# Patient Record
Sex: Female | Born: 1975
Health system: Midwestern US, Academic
[De-identification: ages and names within clinical notes are randomized; demographics above are authoritative.]

---

## 2016-11-30 ENCOUNTER — Encounter: Admit: 2016-11-30 | Discharge: 2016-11-30

## 2020-06-01 IMAGING — CR [ID]
3 series · 3 of 3 positions shown · non-contrast
Comparison: none

[lspine ap]
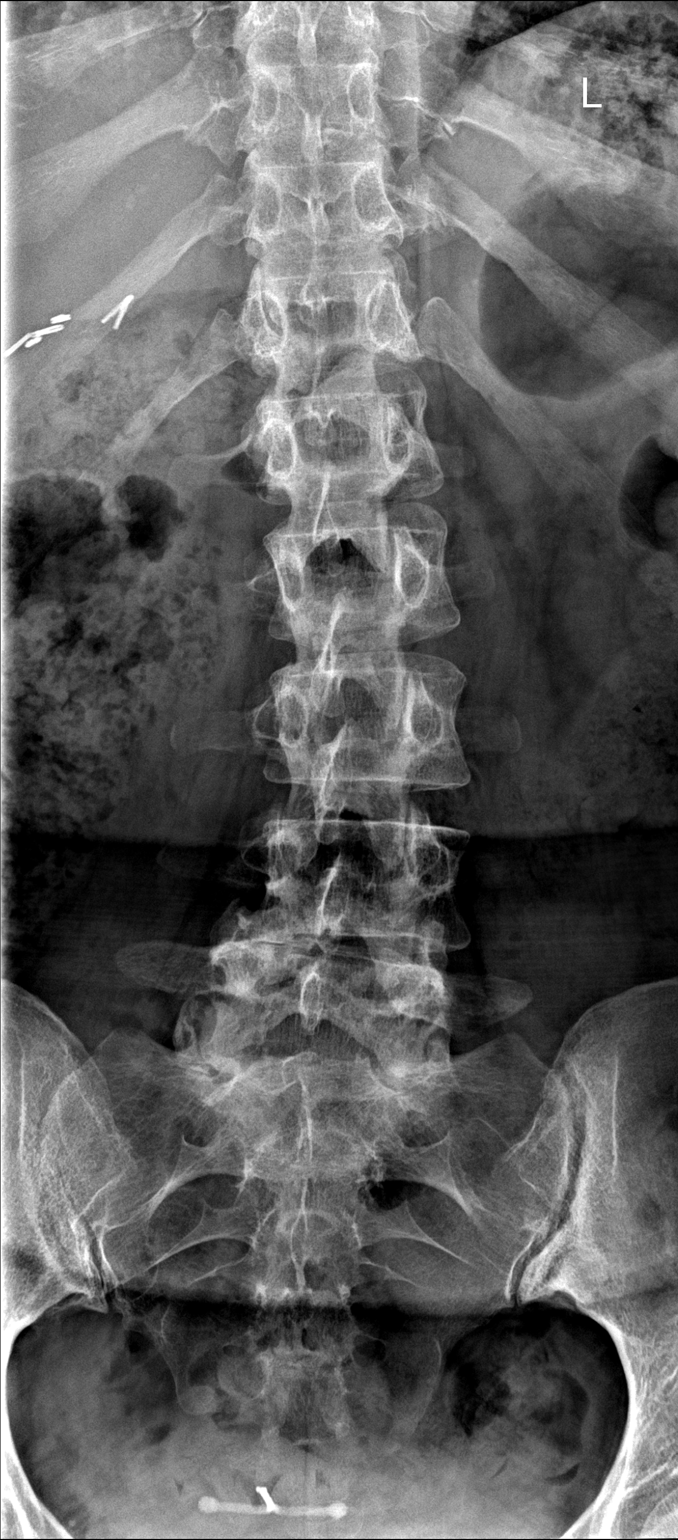

[lspine obl]
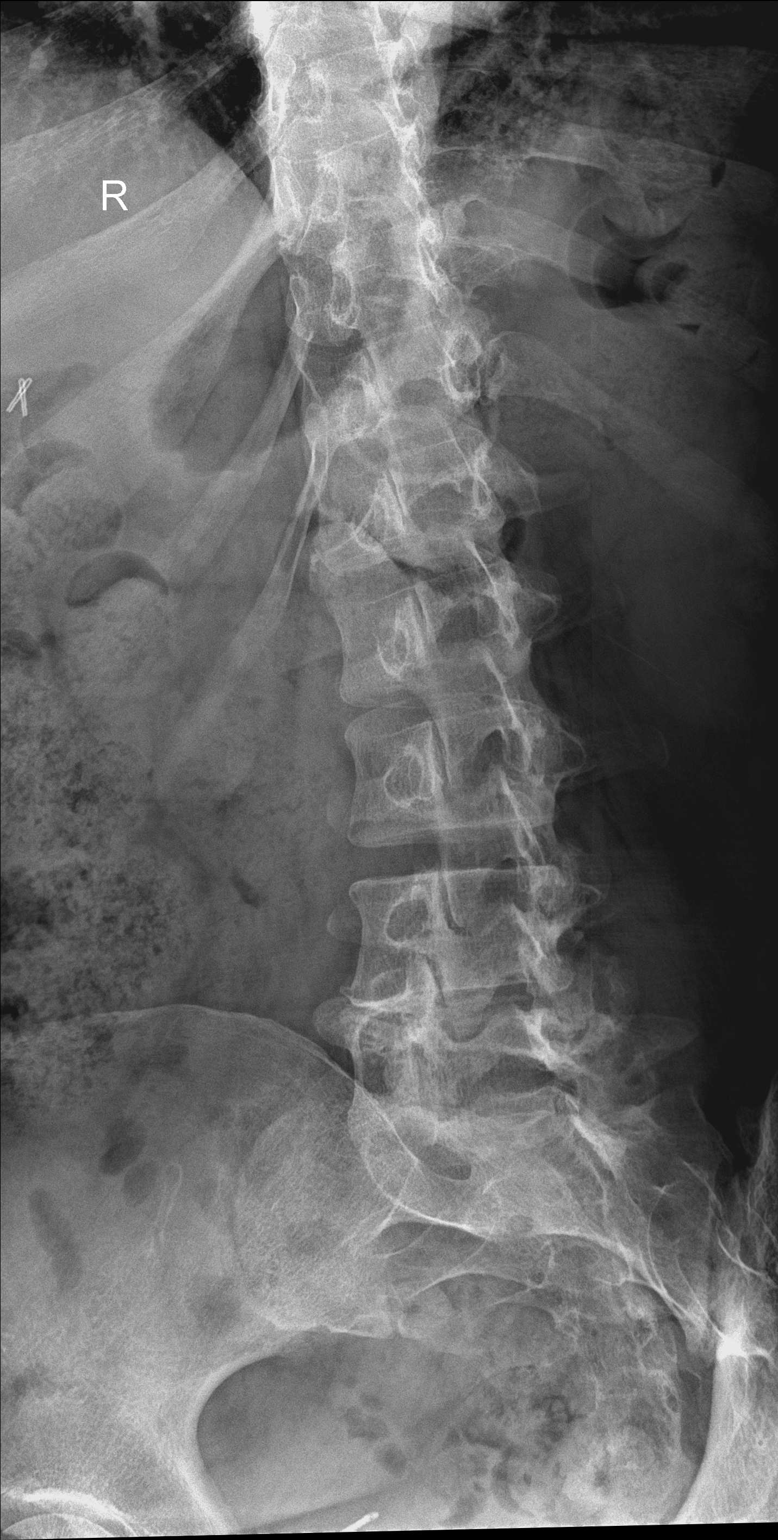

[lspine lat]
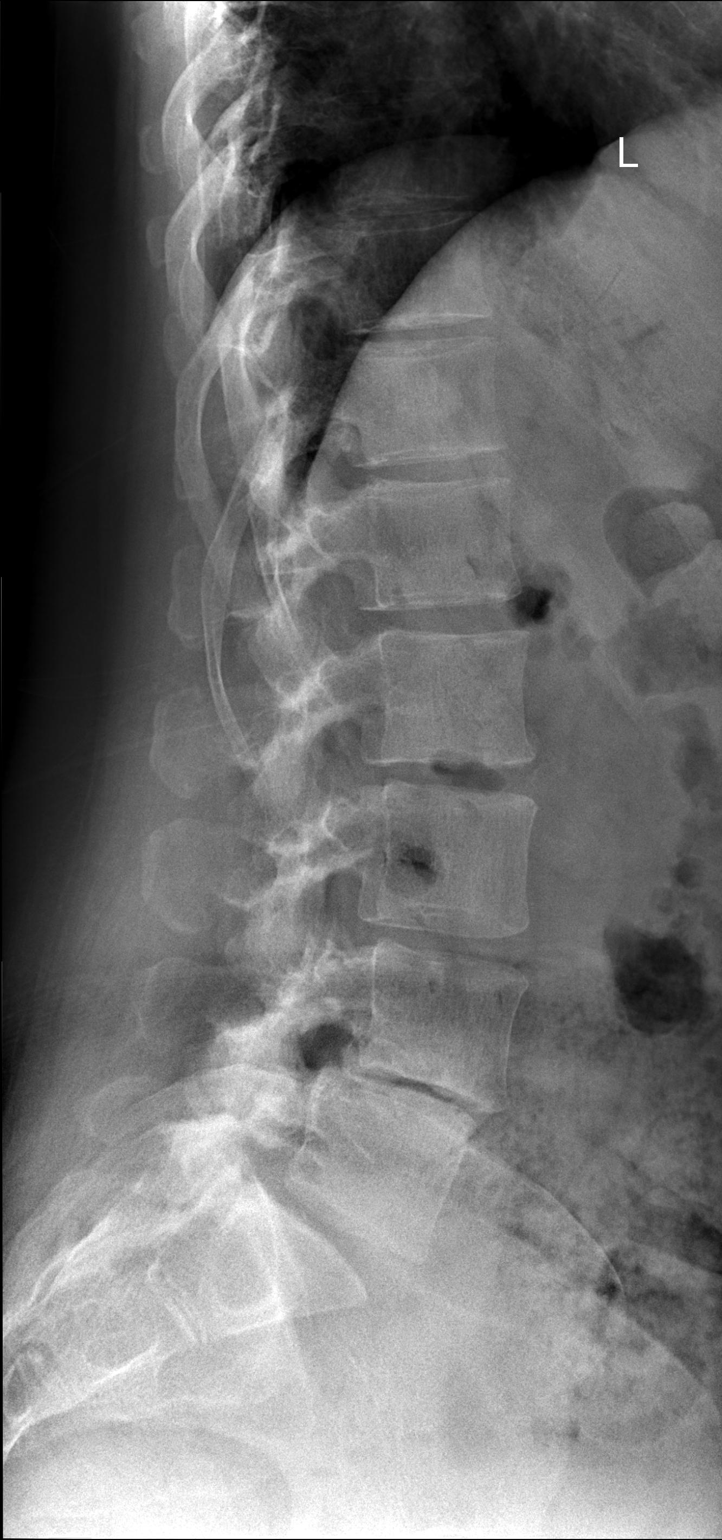

[3 of 3 positions shown; findings below may reference images not displayed]

DIAGNOSTIC STUDIES

EXAM

XR lumbar spine 5 view

INDICATION

back pain
Patient complains of left lower back pain that will radiate down left leg sometimes. No known
injury.

TECHNIQUE

AP, bilateral oblique, lateral, and cone-down lateral lumbar spine views.

COMPARISONS

None.

FINDINGS

Cholecystectomy clips are noted in the right upper quadrant. An IUD is also noted in the pelvis.
Mild apex left curvature of the lumbar spine. No compression deformity of the lumbar vertebrae.
There is grade 1 anterolisthesis of L4 on L5 with moderate to severe loss of disc height and
endplate sclerosis also noted at L4-5. The other disc spaces are well maintained. There is slight
anterolisthesis of L5 on S1. No compelling evidence of spondylolysis. Facet arthropathy is noted in
the lower lumbar spine.

IMPRESSION

Disc degeneration at L4-5 with grade 1 anterolisthesis of L4 on L5.

Tech Notes:

Patient complains of left lower back pain that will radiate down left leg sometimes. No known
injury.

## 2020-08-19 IMAGING — MR L-spine^LUMBAR BLOCK
3 series · 23 of 23 positions shown · non-contrast
Comparison: none

[Series 2: T2 · sagittal · 4.0mm · 0.62mm/px · 12 of 12 slices shown (1 of 2)]
[im 1/12]
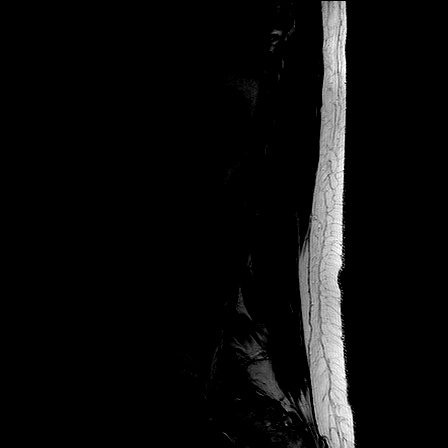
[im 2/12]
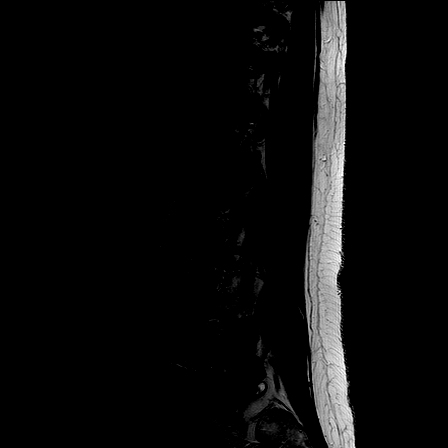
[im 3/12]
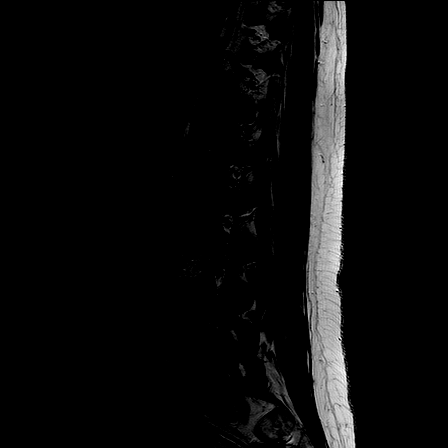
[im 4/12]
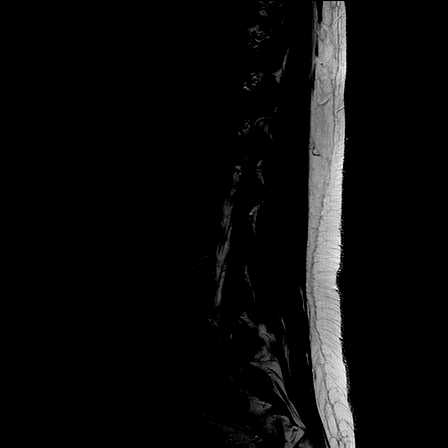
[im 5/12]
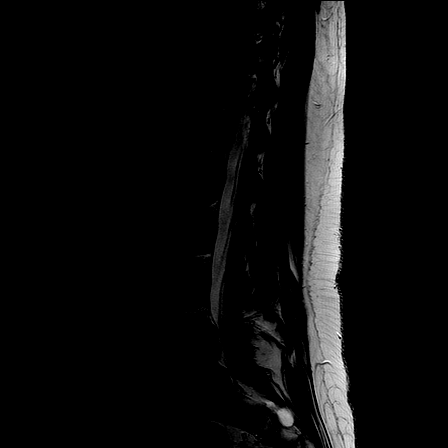
[im 6/12]
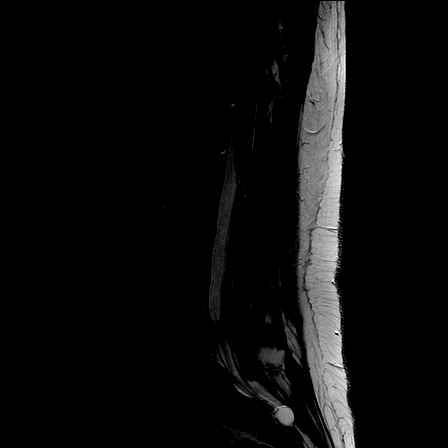
[im 7/12]
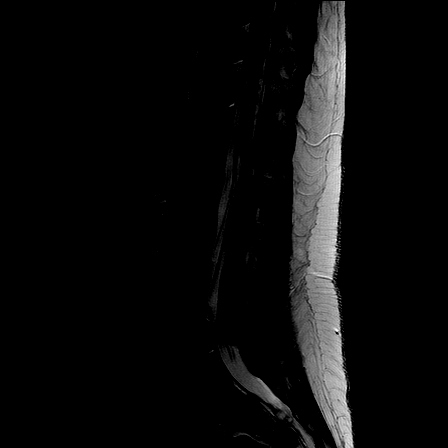
[im 8/12]
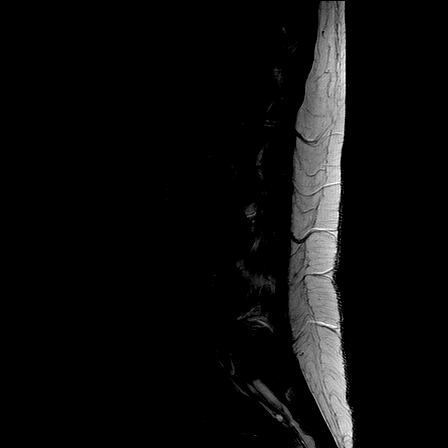
[im 9/12]
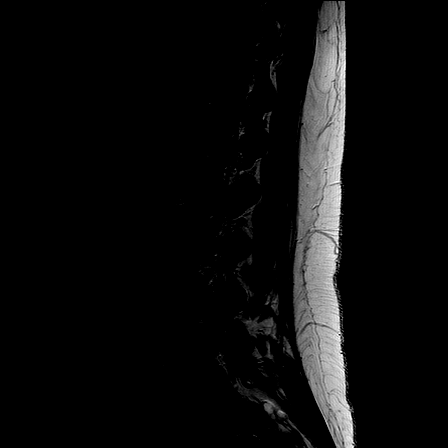
[im 10/12]
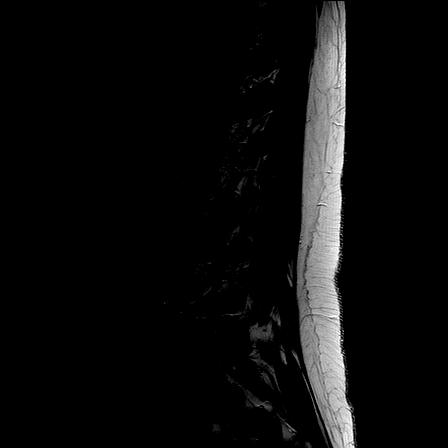
[im 11/12]
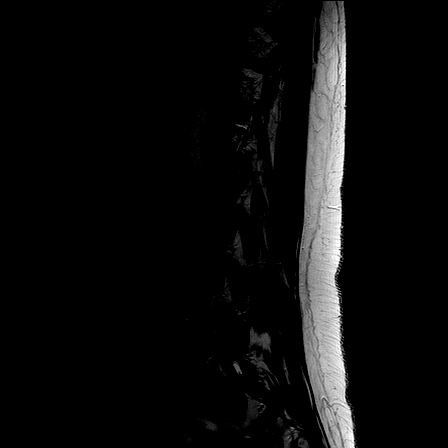
[im 12/12]
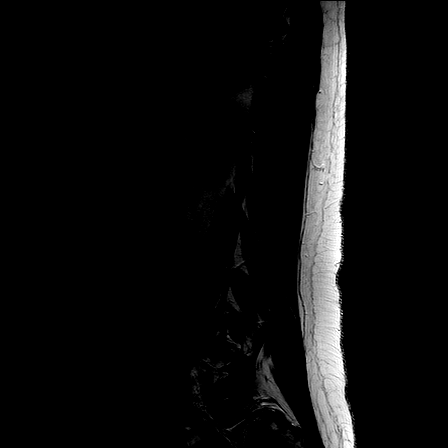

[Series 5: T2 · axial · 4.5mm · 0.49mm/px · z∈[-120,+36]mm · 10 of 10 slices shown (2 of 2)]
[im 1/10]
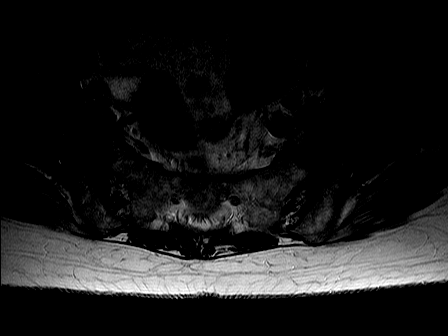
[im 2/10]
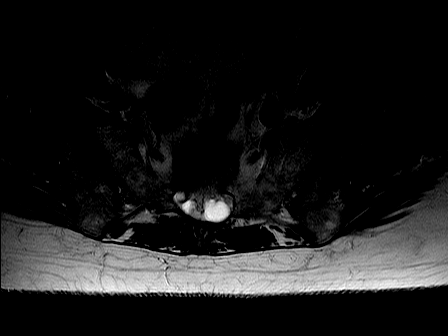
[im 3/10]
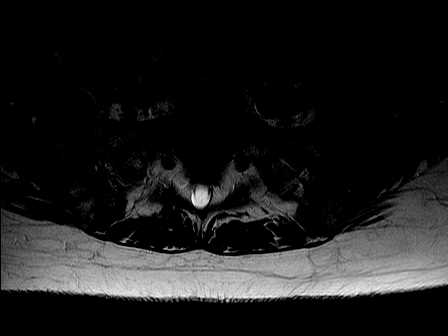
[im 4/10]
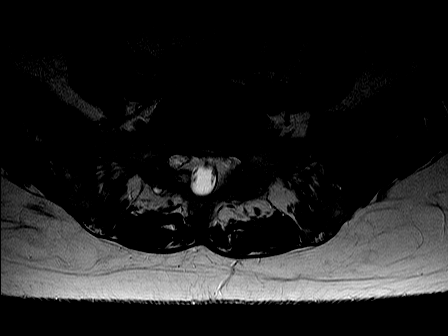
[im 5/10]
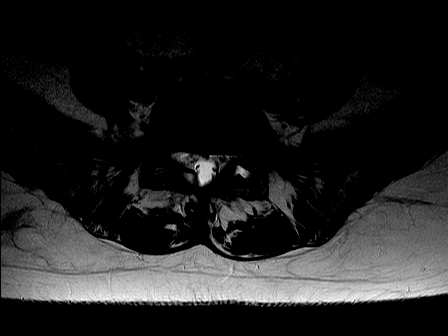
[im 6/10]
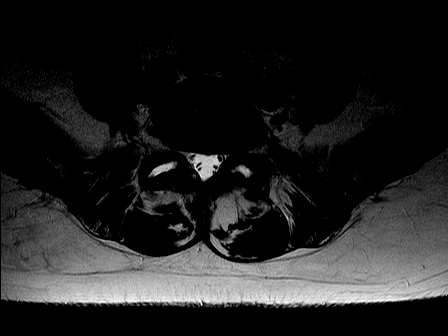
[im 7/10]
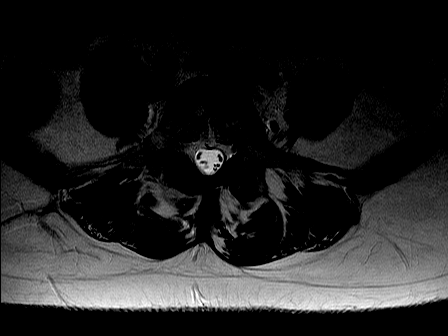
[im 8/10]
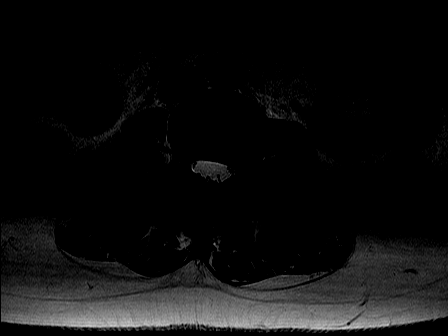
[im 9/10]
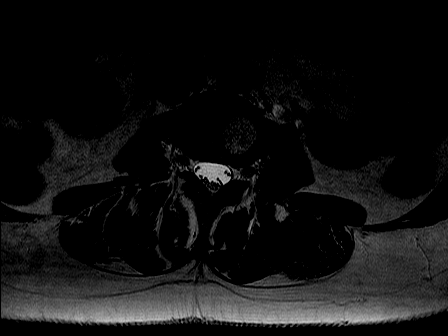
[im 10/10]
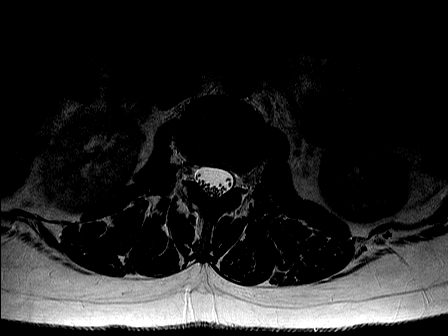

[Series 6: T1 · axial · 4.5mm · 0.86mm/px · 1 of 1 slices shown]
[im 1/1]
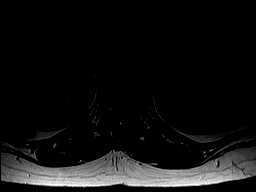

[23 of 23 positions shown; findings below may reference images not displayed]

DIAGNOSTIC STUDIES

EXAM

MAGNETIC RESONANCE IMAGING, SPINAL CANAL AND CONTENTS, LUMBAR; WITHOUT CONTRAST MATERIAL CPT 49520

INDICATION

Back pain.

TECHNIQUE

Routine imaging sequences were obtained on a GE system.

COMPARISONS

X-ray 06/01/2020.

FINDINGS

There is mild scoliosis with convexity to the left, an apex at the level of L3/4 and a Cobb angle
of 12 degrees. Grade 1 spondylolisthesis at the level L4. The marrow signal is slightly
heterogeneous. There are multiple hemangiomas. Prominent Schmorl's node at the level of L4-5. No
suspicious marrow lesion. Tarlov cyst at the level of S2. No significant marrow edema. No acute
displaced fracture or dislocation. The conus is located at the level of L1.

L1-L2: No significant disc bulge or protrusion.

L2-L3: No significant disc bulge or protrusion.

L3-L4: No significant disc bulge or protrusion.

L4-L5: Diffuse disc bulge with a moderate degree of ligamentum flava hypertrophy and facet
arthropathy. Moderate central canal stenosis. Severe neural foramen stenosis on the right.

L5-S1: Diffuse disc bulge with a moderate degree of ligamentum flava hypertrophy and facet
arthropathy. Mild central canal stenosis. Severe neural foramen stenosis on the left.

No enlarged retroperitoneal nodes. No paraspinal fluid collections. Paravertebral soft tissues are
not remarkable.

IMPRESSION

L4-L5: Diffuse disc bulge with a moderate degree of ligamentum flava hypertrophy and facet
arthropathy. Moderate central canal stenosis. Severe neural foramen stenosis on the right.

L5-S1: Diffuse disc bulge with a moderate degree of ligamentum flava hypertrophy and facet
arthropathy. Mild central canal stenosis. Severe neural foramen stenosis on the left.

Follow-up with a neurosurgical consultation is recommended.

Tech Notes:

chronic low back pain that is worsening, left leg radiculopathy

## 2020-08-26 IMAGING — CR [ID]
3 series · 3 of 3 positions shown · non-contrast
Comparison: none

[tspine swimmers]
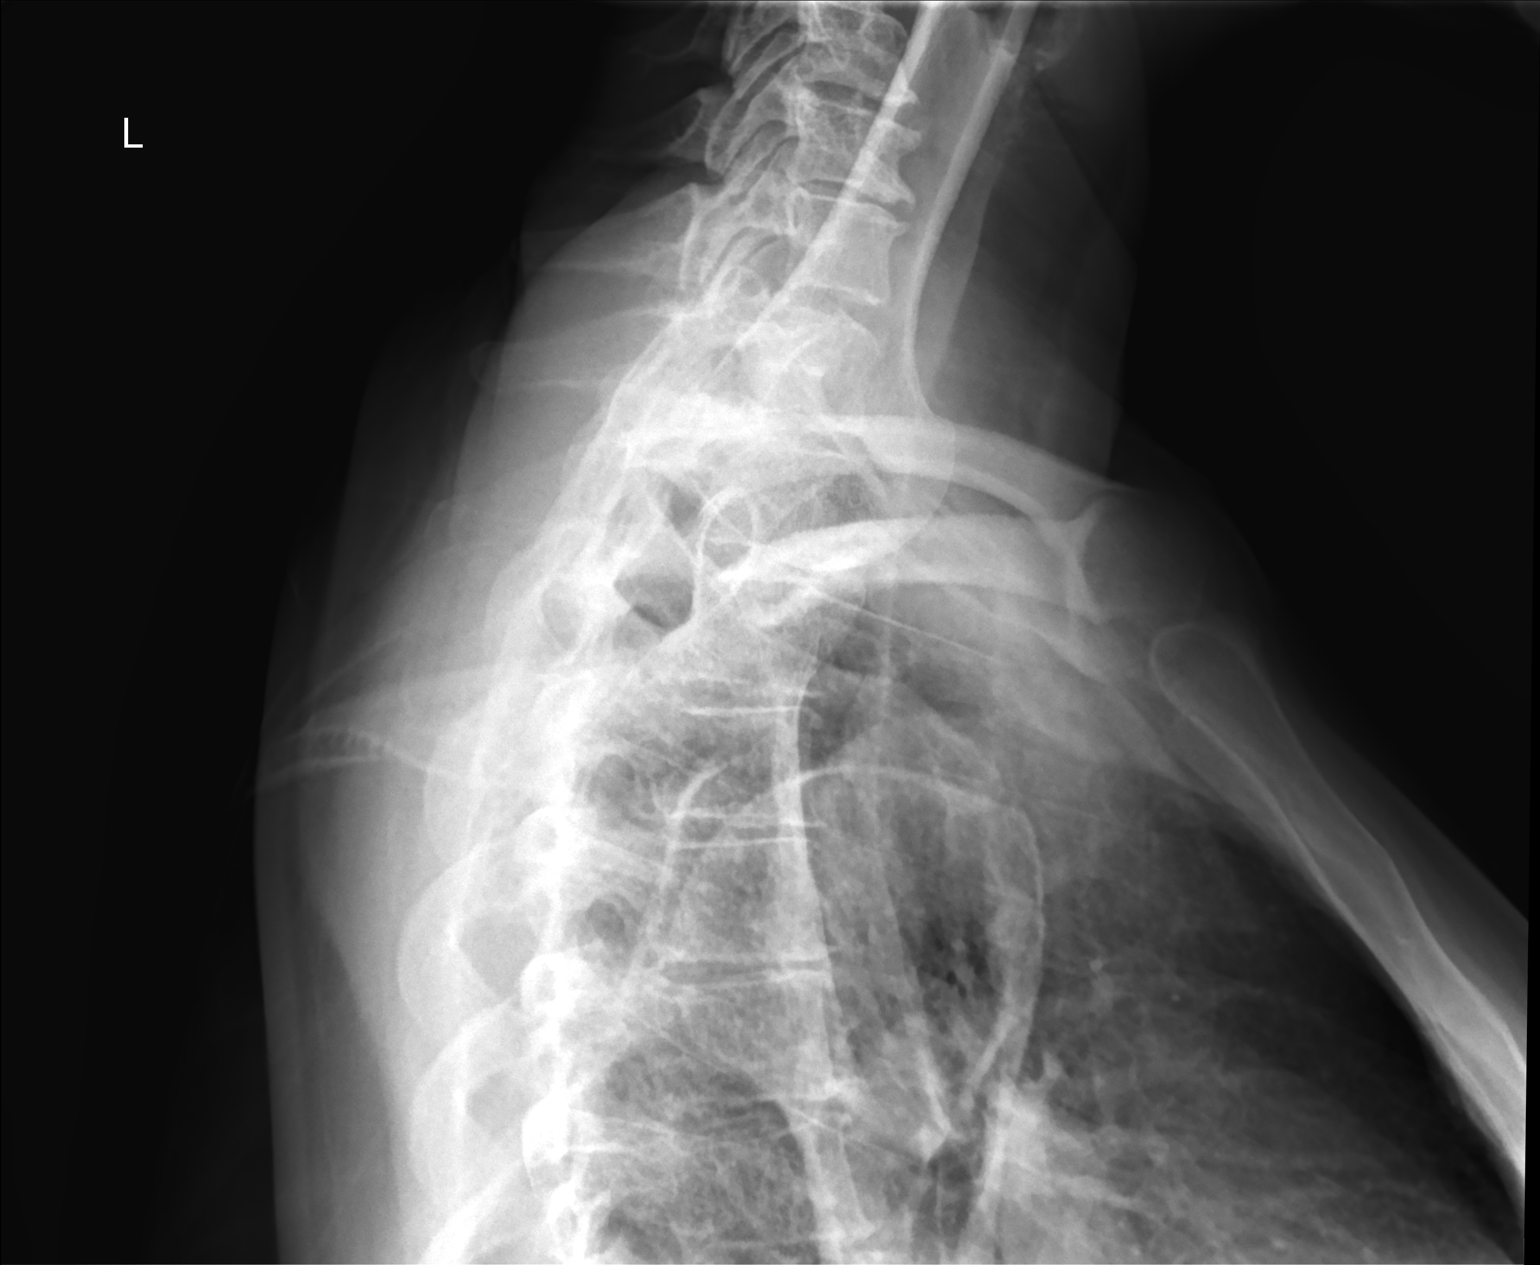

[tspine lat breathing]
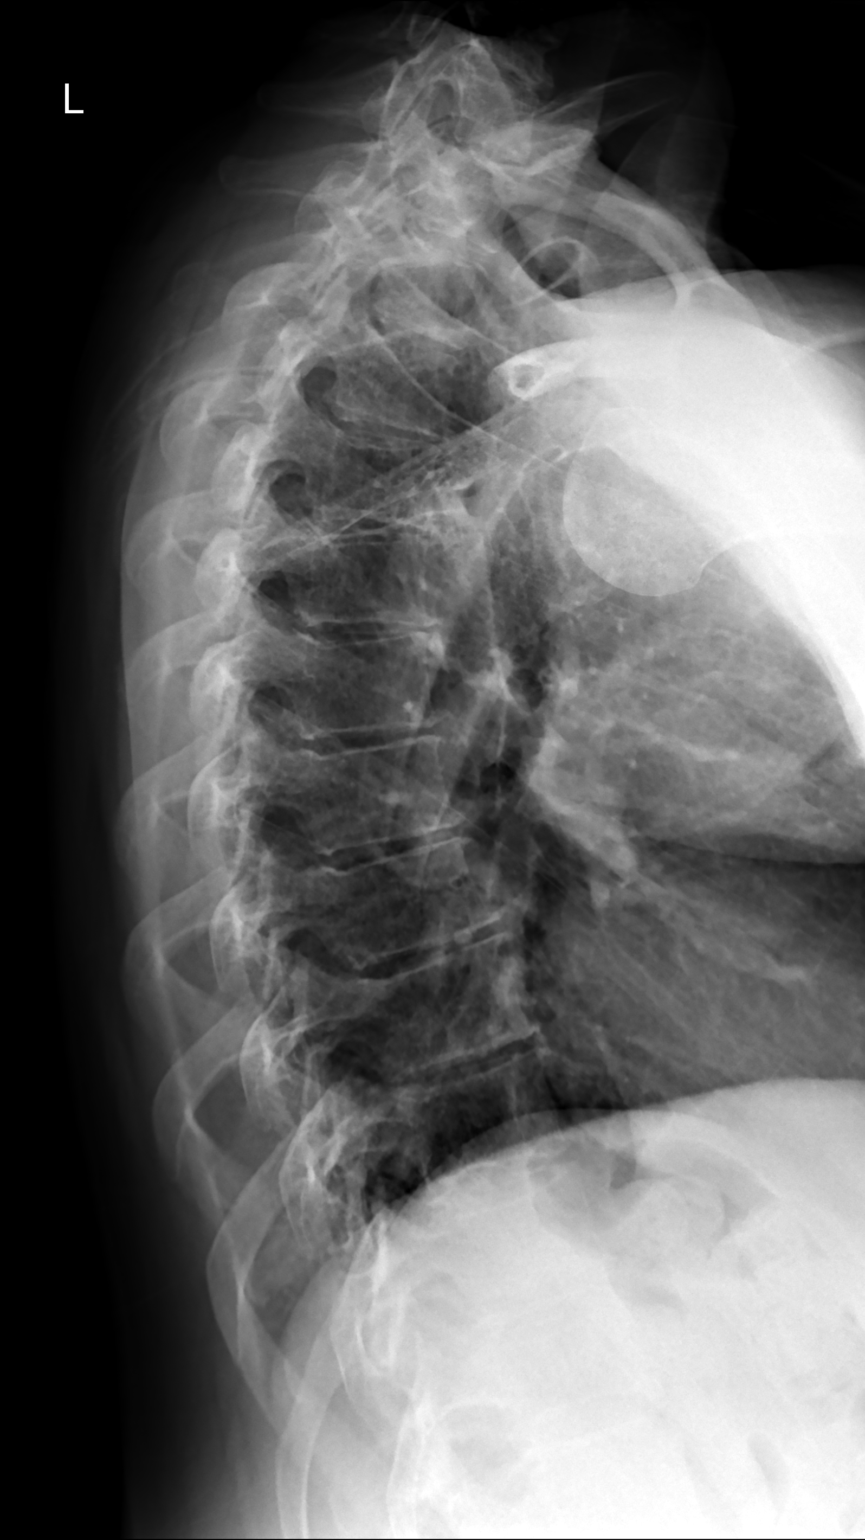

[tspine ap]
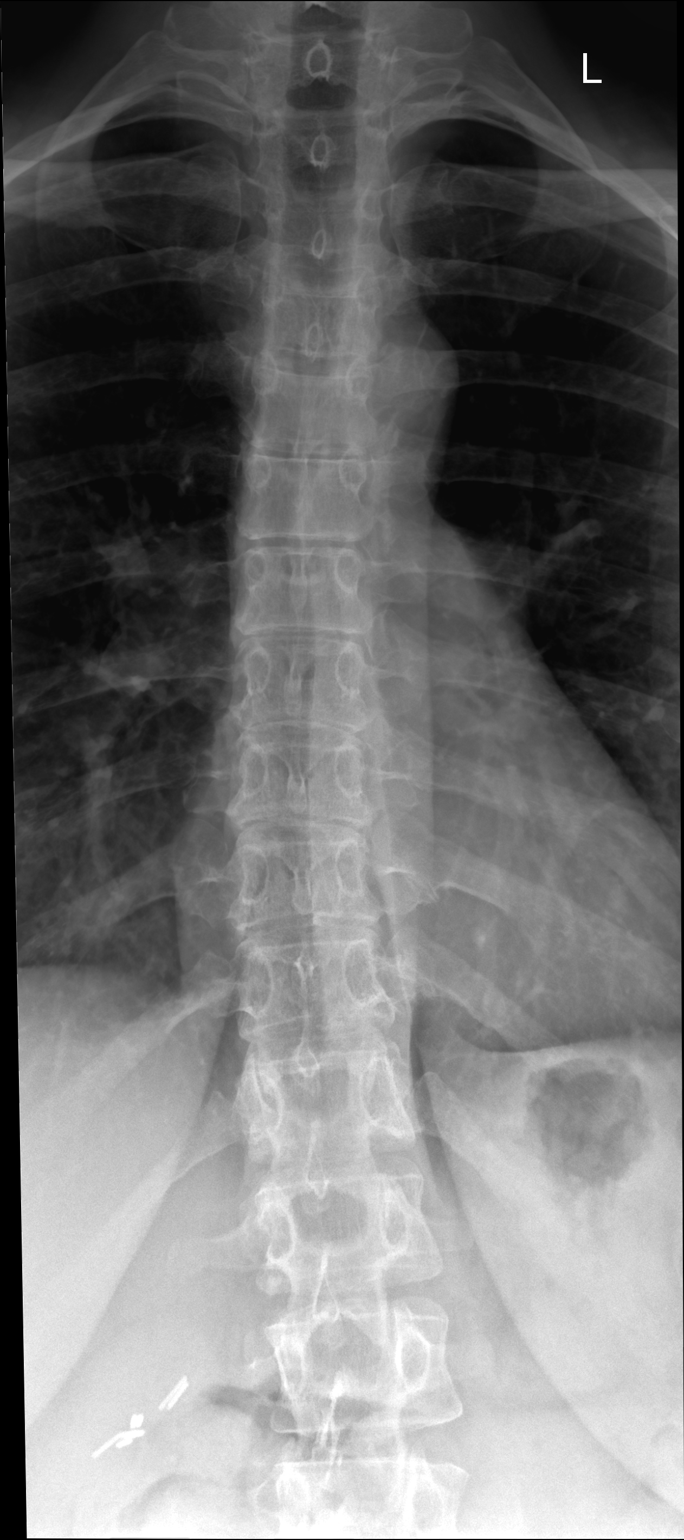

[3 of 3 positions shown; findings below may reference images not displayed]

DIAGNOSTIC STUDIES

EXAM

Cervical and thoracic spine:

INDICATION

Pain.

COMPARISONS

No priors available for comparison.

FINDINGS

Cervical: There is severe loss of disc height with associated sclerosis and osteophyte formation at
the level of C5/6 and C6/7. No acute fracture. No prevertebral soft tissue swelling.

Thoracic: Mild convexity to the right with an apex at the level of T8 and a Cobb angle of 11
degrees. The pedicles appear intact. Mild degenerative changes.

IMPRESSION

Cervical: Severe degenerative changes within the lower cervical spine.

Thoracic: Mild multilevel degenerative changes.

Further evaluation with MRI is suggested if clinically indicated.

Tech Notes:

PAIN TO THE LOWER BACK WITH NUMBNESS AND TINGLING TO HANDS AND FEET.

## 2020-09-02 ENCOUNTER — Encounter: Admit: 2020-09-02 | Discharge: 2020-09-02

## 2020-09-02 DIAGNOSIS — I499 Cardiac arrhythmia, unspecified: Secondary | ICD-10-CM

## 2020-09-05 ENCOUNTER — Encounter: Admit: 2020-09-05 | Discharge: 2020-09-05

## 2020-10-26 ENCOUNTER — Encounter: Admit: 2020-10-26 | Discharge: 2020-10-26 | Payer: BC Managed Care – PPO

## 2020-10-26 DIAGNOSIS — G959 Disease of spinal cord, unspecified: Secondary | ICD-10-CM

## 2020-10-26 NOTE — Patient Instructions
It was nice to see you today.  Thank you for choosing to visit our clinic.  Your time is important, and if you had to wait today, we do apologize.  Our goal is to run exactly on time.  However, on occasion, we get behind in clinic due to unexpected patient issues.  Thank you for your patience.    General Instructions:   Scheduling:  Our scheduling phone number is [phone].   Appointment Reminders on your cell phone:  Make sure we have your cell phone number in your account, and text [place] to [phone].   How to reach our office:  Please send a MyChart message to the [facility] or leave a voicemail for the nurse Malek Skog, RN, BSN at [phone].   How to get a medication refill:  Please use the MyChart Refill request or contact your pharmacy directly to request medication refills.  Please allow 72 business hours for request to be completed.     Support for many chronic illnesses is available through [facility] at [email or web address] or [phone].     For help with MyChart:  please call [phone].     For questions on nights, weekends or holidays:  call the Operator at [phone], and ask for the doctor on call for Neurosurgery.     For more information on spinal conditions:  please visit [email or web address]    Our office fax number is [phone]    Again, thank you for coming in today.       Shoshana Johal, RN, BSN  Clinical Nurse Coordinator for Dr. Ifije Ohiorhenuan  Ambulatory Clinic Nurse Supervisor  Marc A. Asher, MD, [facility]  [facility]  Phone [phone]  Fax [phone]

## 2020-11-09 ENCOUNTER — Encounter: Admit: 2020-11-09 | Discharge: 2020-11-09

## 2020-11-16 ENCOUNTER — Ambulatory Visit: Admit: 2020-11-16 | Discharge: 2020-11-16 | Payer: BC Managed Care – PPO

## 2020-11-16 ENCOUNTER — Encounter: Admit: 2020-11-16 | Discharge: 2020-11-16 | Payer: BC Managed Care – PPO

## 2020-11-16 DIAGNOSIS — I499 Cardiac arrhythmia, unspecified: Secondary | ICD-10-CM

## 2020-11-16 DIAGNOSIS — J45909 Unspecified asthma, uncomplicated: Secondary | ICD-10-CM

## 2020-11-16 DIAGNOSIS — M5020 Other cervical disc displacement, unspecified cervical region: Secondary | ICD-10-CM

## 2020-11-16 DIAGNOSIS — M2550 Pain in unspecified joint: Secondary | ICD-10-CM

## 2020-11-16 DIAGNOSIS — G959 Disease of spinal cord, unspecified: Secondary | ICD-10-CM

## 2020-11-16 DIAGNOSIS — I10 Essential (primary) hypertension: Secondary | ICD-10-CM

## 2020-11-16 DIAGNOSIS — K5900 Constipation, unspecified: Secondary | ICD-10-CM

## 2020-11-16 DIAGNOSIS — M5136 Other intervertebral disc degeneration, lumbar region: Secondary | ICD-10-CM

## 2020-11-16 DIAGNOSIS — F419 Anxiety disorder, unspecified: Secondary | ICD-10-CM

## 2020-11-16 DIAGNOSIS — G971 Other reaction to spinal and lumbar puncture: Secondary | ICD-10-CM

## 2020-11-16 DIAGNOSIS — F32A Depression: Secondary | ICD-10-CM

## 2020-11-16 DIAGNOSIS — M4316 Spondylolisthesis, lumbar region: Secondary | ICD-10-CM

## 2020-11-16 DIAGNOSIS — M4800 Spinal stenosis, site unspecified: Secondary | ICD-10-CM

## 2020-11-16 DIAGNOSIS — I209 Angina pectoris, unspecified: Secondary | ICD-10-CM

## 2020-11-16 DIAGNOSIS — T148XXA Other injury of unspecified body region, initial encounter: Secondary | ICD-10-CM

## 2020-11-16 DIAGNOSIS — M5030 Other cervical disc degeneration, unspecified cervical region: Secondary | ICD-10-CM

## 2020-11-16 DIAGNOSIS — R519 Generalized headaches: Secondary | ICD-10-CM

## 2020-11-16 DIAGNOSIS — D539 Nutritional anemia, unspecified: Secondary | ICD-10-CM

## 2020-11-16 DIAGNOSIS — M797 Fibromyalgia: Secondary | ICD-10-CM

## 2020-11-16 NOTE — Progress Notes
Date of Service: 11/16/2020        Chief complaint    Chief Complaint   Patient presents with   ? Back Pain   ? Neck Pain       HPI  Julia Jensen is a 45 y.o. female who presents with a history of  chronic neck pain and chronic low back pain.  She complains of low back pain which is worse with activity and relieved with rest.  The pain starts in her low back and radiates to posterior thighs into her left buttock.  In addition, she complains of numbness bilateral feet numbness. Recently underwent epidural steroid injections low back with good relief of symptoms this was approximately 1 week ago.  She reports near complete resolution of her low back pain.  With respect to her neck, she complains of chronic neck pain worse with activity relieved with rest.  In addition she notes some intermittent numbness in bilateral hands.  She also complains of difficulty with fine motor tasks occasionally drops things.  She also complains of neck pain radiating to left shoulder.  She has a history of a bilateral carpal tunnel release several years ago           MME Total Score  MME Score: 0     PMH    Medical History:   Diagnosis Date   ? Angina pectoris (HCC) In the past, not currently   ? Anxiety 2008   ? Arrhythmia    ? Asthma Symptoms only when exercising   ? Chest pain    ? Constipation off and on over the past 15 years   ? Degenerative disc disease, cervical diagnosed in 05/2020 struggle w/it since 1993   ? Degenerative disc disease, lumbar diagnosed in 05/2020 struggle w/it since 1993   ? Depression 2008, severe & have tried A LOT of different meds   ? Essential hypertension 2013?   ? Essential hypertension, benign 2013?   ? 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 Generalized headaches 2008?   ? Joint pain 2008   ? Nerve injury from carpal tunnel prior to 2013 surgery   ? Spinal headache 05/2201    after epidural during childbirth   ? Spinal stenosis diagnosed in 05/2020 struggle w/it since 1993   ? Unspecified deficiency anemia I have had it in the past           ROS  Review of Systems       FH    Family History   Problem Relation Age of Onset   ? Anesthetic Complication Mother         vomiting after general anesthesia   ? Hypertension Mother    ? Coronary Artery Disease Father    ? Premature Heart Disease Father    ? Arthritis Father         Rheumatoid arthritis   ? Heart problem Father         CAD, CABG in 2015   ? Hypertension Father    ? Joint Pain Father         due to RA   ? Coronary Artery Disease Paternal Aunt    ? Premature Heart Disease Paternal Aunt    ? Coronary Artery Disease Paternal Grandmother    ? Coronary Artery Disease Paternal Grandfather    ? Coronary Artery Disease Other    ? Back pain Brother  same issues as myself. He is 73yrs younger than me.   ? Scoliosis Brother      Social History     Socioeconomic History   ? Marital status: Married   Tobacco Use   ? Smoking status: Never Smoker   Substance and Sexual Activity   ? Alcohol use: Yes     Alcohol/week: 1.0 standard drink     Types: 1 Glasses of wine per week   ? Drug use: No   ? Sexual activity: Yes     Partners: Male     Birth control/protection: I.U.D.           SH    No past surgical history on file.    Meds    ? buPROPion HCL SR (WELLBUTRIN SR) 150 mg tablet, 12 hr sustained-release Take 150 mg by mouth twice daily.   ? cetirizine (ZYRTEC) 10 mg tablet Take 10 mg by mouth every morning.   ? cycloSPORINE (RESTASIS) 0.05 % ophthalmic emulsion Apply 1 drop to both eyes twice daily.   ? diclofenac sodium DR (VOLTAREN) 75 mg tablet Take 75 mg by mouth twice daily.   ? duloxetine DR (CYMBALTA) 60 mg capsule Take 60 mg by mouth daily.   ? ergocalciferol (vitamin D2) (VITAMIN D PO) Take  by mouth.   ? fluticasone propionate (FLONASE) 50 mcg/actuation nasal spray, suspension Apply 2 sprays to each nostril as directed daily. Shake bottle gently before using.   ? HYDROCHLOROTHIAZIDE PO Take 30 mg by mouth. PRN   ? lisinopril (PRINIVIL; ZESTRIL) 20 mg tablet Take 20 mg by mouth daily.   ? lisinopril (ZESTRIL) 10 mg tablet Take 10 mg by mouth daily.   ? medroxyPROGESTERone (contraceptive) (DEPO-PROVERA) syringe Inject 150 mg to area(s) as directed every 90 days.   ? modafiniL (PROVIGIL) 200 mg tablet Take 200 mg by mouth daily.   ? pseudoephedrine hcl (SUDAFED) 30 mg tablet Take 60 mg by mouth every 4 hours as needed for Congestion.   ? sertraline (ZOLOFT) 100 mg tablet Take 100 mg by mouth daily.       Allergies    Allergies   Allergen Reactions   ? Penicillin G UNKNOWN         Exam  Physical Exam   Constitutional: she is oriented to person, place, and time. she appears well-developed and well-nourished.    Head: Normocephalic and atraumatic.    Eyes: Conjunctivae and EOM are normal.    Pulmonary/Chest: Effort normal. No respiratory distress.   Neurological: she is alert and oriented to person, place, and time. No cranial nerve deficit or sensory deficit. she exhibits normal muscle tone. Coordination normal.   Skin: Skin is warm and dry.   Psychiatric: she has a normal mood and affect. her behavior is normal. Judgment and thought content normal.    Vitals reviewed.  A&Ox3  FS, TML, EOMI      D B Tr Hg IO  R 5/5 5/5 5/5 5/5 5/5  L 5/5 5/5 5/5 5/5 5/5        HF KE DF PF EHL  R  5/5 5/5 5/5 5/5 5/5  L  5/5 5/5 5/5 5/5 5/5  Right greater than left Hoffman's reflex  Brisk patella tendon reflexes bilaterally    Vitals:   Vitals:    11/16/20 0923   BP: 116/85   BP Source: Arm, Left Upper   Pulse: 79   SpO2: 100%   PainSc: Eight   Weight: 87.1 kg (192  lb)   Height: 160 cm (5' 3)     Body mass index is 34.01 kg/m?.      Imaging:   I independently reviewed the patient's imaging findings:  MRI of lumbar spine reviewed notable for severe stenosis at L4-5 secondary grade 1 spondylolisthesis.  MRI of cervical spine reviewed notable for degenerative disc disease at multiple levels there is moderate stenosis which is worst at C5-6 and C6-7.  There is severe bilateral foraminal stenosis at these levels.  There is ventral effacement of the CSF fluid from C4-C7 due to disc osteophyte complexes.    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  Given her lumbar spondylolisthesis if she fails conservative management she may ultimately require lumbar fusion.                                Encounter Medications   Medications   ? lisinopril (ZESTRIL) 10 mg tablet     Sig: Take 10 mg by mouth daily.   ? buPROPion HCL SR (WELLBUTRIN SR) 150 mg tablet, 12 hr sustained-release     Sig: Take 150 mg by mouth twice daily.   ? modafiniL (PROVIGIL) 200 mg tablet     Sig: Take 200 mg by mouth daily.   ? fluticasone propionate (FLONASE) 50 mcg/actuation nasal spray, suspension     Sig: Apply 2 sprays to each nostril as directed daily. Shake bottle gently before using.   ? cycloSPORINE (RESTASIS) 0.05 % ophthalmic emulsion     Sig: Apply 1 drop to both eyes twice daily.   ? cetirizine (ZYRTEC) 10 mg tablet     Sig: Take 10 mg by mouth every morning.   ? duloxetine DR (CYMBALTA) 60 mg capsule     Sig: Take 60 mg by mouth daily.   ? ergocalciferol (vitamin D2) (VITAMIN D PO)     Sig: Take  by mouth.   ? pseudoephedrine hcl (SUDAFED) 30 mg tablet     Sig: Take 60 mg by mouth every 4 hours as needed for Congestion.   ? HYDROCHLOROTHIAZIDE PO     Sig: Take 30 mg by mouth. PRN                              Please note that documentation of records were done during a busy neurosurgical clinic. Attempts have been made to review the document for any errors. Please excuse for brevity and typographical errors.

## 2020-12-16 NOTE — Patient Instructions
It was nice to see you today.  Thank you for choosing to visit our clinic.  Your time is important, and if you had to wait today, we do apologize.  Our goal is to run exactly on time.  However, on occasion, we get behind in clinic due to unexpected patient issues.  Thank you for your patience.    General Instructions:  Scheduling:  Our scheduling phone number is [phone].  How to reach our office:  Please send a MyChart message to the [facility] or leave a voicemail for the nurse Yeilyn Gent, RN, BSN at [phone].  How to get a medication refill:  Please use the MyChart Refill request or contact your pharmacy directly to request medication refills.  Please allow 72 business hours for request to be completed.    Support for many chronic illnesses is available through [facility] at [email or web address] or [phone].    For help with MyChart:  please call [phone].    For questions on nights, weekends or holidays:  call the Operator at [phone], and ask for the doctor on call for Neurosurgery.    For more information on spinal conditions:  please visit [email or web address]   Our office fax number is [phone]    Again, thank you for coming in today.        Aretha Levi, RN, BSN  Clinical Nurse Coordinator for Dr. Ifije Ohiorhenuan  Marc A. Asher, MD, [facility]  [facility]  Phone [phone]  Fax [phone]

## 2020-12-28 ENCOUNTER — Ambulatory Visit: Admit: 2020-12-28 | Discharge: 2020-12-29 | Payer: BC Managed Care – PPO

## 2020-12-28 DIAGNOSIS — M4316 Spondylolisthesis, lumbar region: Secondary | ICD-10-CM

## 2020-12-28 NOTE — Progress Notes
Date of Service: 12/28/2020  Obtained patient's verbal consent to treat them and their agreement to [facility] financial policy and NPP via this telehealth visit during the [facility]        Chief complaint    Chief Complaint   Patient presents with   ? Back Pain       HPI  Julia Jensen is a 45 y.o. female who presents with a history of low back pain difficulty walking found to have severe stenosis at L4-5 secondary to disc osteophyte complex and grade 1 spondylolisthesis  She recently underwent epidural steroid injections.  She has had good relief of her symptoms after those injections.  Most of her pain is manageable with lifestyle modifications.  She reports that the end of a long day she has a 2 out of 10 back pain                  Oswestry: Oswestry Total Score:: 14       PMH    Medical History:   Diagnosis Date   ? Angina pectoris (HCC) In the past, not currently   ? Anxiety 2008   ? Arrhythmia    ? Asthma Symptoms only when exercising   ? Chest pain    ? Constipation off and on over the past 15 years   ? Degenerative disc disease, cervical diagnosed in 05/2020 struggle w/it since 1993   ? Degenerative disc disease, lumbar diagnosed in 05/2020 struggle w/it since 1993   ? Depression 2008, severe & have tried A LOT of different meds   ? Essential hypertension 2013?   ? Essential hypertension, benign 2013?   ? 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 Generalized headaches 2008?   ? Joint pain 2008   ? Nerve injury from carpal tunnel prior to 2013 surgery   ? Spinal headache 05/2201    after epidural during childbirth   ? Spinal stenosis diagnosed in 05/2020 struggle w/it since 1993   ? Unspecified deficiency anemia I have had it in the past           ROS  Review of Systems       FH    Family History   Problem Relation Age of Onset   ? Anesthetic Complication Mother         vomiting after general anesthesia   ? Hypertension Mother    ? Coronary Artery Disease Father    ? Premature Heart Disease Father    ? Arthritis Father         Rheumatoid arthritis   ? Heart problem Father         CAD, CABG in 2015   ? Hypertension Father    ? Joint Pain Father         due to RA   ? Coronary Artery Disease Paternal Aunt    ? Premature Heart Disease Paternal Aunt    ? Coronary Artery Disease Paternal Grandmother    ? Coronary Artery Disease Paternal Grandfather    ? Coronary Artery Disease Other    ? Back pain Brother         same issues as myself. He is 46yrs younger than me.   ? Scoliosis Brother      Social History     Socioeconomic History   ? Marital status: Married   Tobacco Use   ? Smoking status: Never Smoker  Substance and Sexual Activity   ? Alcohol use: Yes     Alcohol/week: 1.0 standard drink     Types: 1 Glasses of wine per week   ? Drug use: No   ? Sexual activity: Yes     Partners: Male     Birth control/protection: I.U.D.           SH    No past surgical history on file.    Meds    ? buPROPion HCL SR (WELLBUTRIN SR) 150 mg tablet, 12 hr sustained-release Take 150 mg by mouth twice daily.   ? cetirizine (ZYRTEC) 10 mg tablet Take 10 mg by mouth every morning.   ? cycloSPORINE (RESTASIS) 0.05 % ophthalmic emulsion Apply 1 drop to both eyes twice daily.   ? diclofenac sodium DR (VOLTAREN) 75 mg tablet Take 75 mg by mouth twice daily.   ? duloxetine DR (CYMBALTA) 60 mg capsule Take 60 mg by mouth daily.   ? ergocalciferol (vitamin D2) (VITAMIN D PO) Take  by mouth.   ? fluticasone propionate (FLONASE) 50 mcg/actuation nasal spray, suspension Apply 2 sprays to each nostril as directed daily. Shake bottle gently before using.   ? HYDROCHLOROTHIAZIDE PO Take 30 mg by mouth. PRN   ? lisinopril (PRINIVIL; ZESTRIL) 20 mg tablet Take 20 mg by mouth daily.   ? lisinopril (ZESTRIL) 10 mg tablet Take 10 mg by mouth daily.   ? medroxyPROGESTERone (contraceptive) (DEPO-PROVERA) syringe Inject 150 mg to area(s) as directed every 90 days.   ? modafiniL (PROVIGIL) 200 mg tablet Take 200 mg by mouth daily.   ? pseudoephedrine hcl (SUDAFED) 30 mg tablet Take 60 mg by mouth every 4 hours as needed for Congestion.   ? sertraline (ZOLOFT) 100 mg tablet Take 100 mg by mouth daily.       Allergies    Allergies   Allergen Reactions   ? Penicillin G UNKNOWN         Exam  Physical Exam   Deferred due to telehealth      Vitals:   Vitals:    12/28/20 1501   PainSc: Two   Weight: 68 kg (150 lb)   Height: 160 cm (5' 3)     Body mass index is 26.57 kg/m?.      Imaging:   I independently reviewed the patient's imaging findings:  No new imaging    Assessment/Plan    45 year old female history of low back pain found to have grade 1 spondylolisthesis L4-5 and severe stenosis   She responded very well to conservative management with epidural steroid injections  She no return to see me in 6 months or sooner if her symptoms worsen                                No orders of the defined types were placed in this encounter.                             Please note that documentation of records were done during a busy neurosurgical clinic. Attempts have been made to review the document for any errors. Please excuse for brevity and typographical errors.

## 2021-08-04 IMAGING — CT SINUS_(Adult)
3 of 4 series · 12 of 47 positions shown, 14 images · non-contrast
Comparison: none

[Series 4: sinus cor 3.00 hr60 s3 · coronal · 0.16mm/px · 3 of 39 slices shown]
[im 13/39  bone]
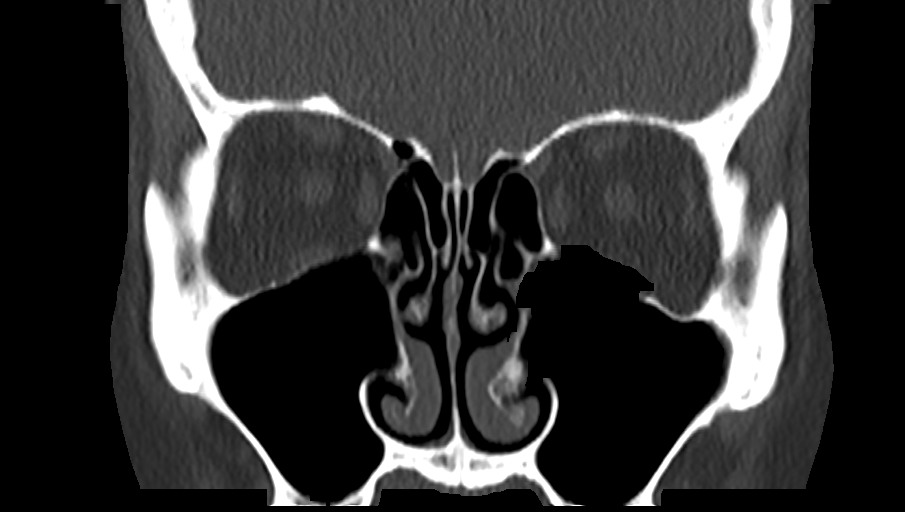
[im 17/39  bone]
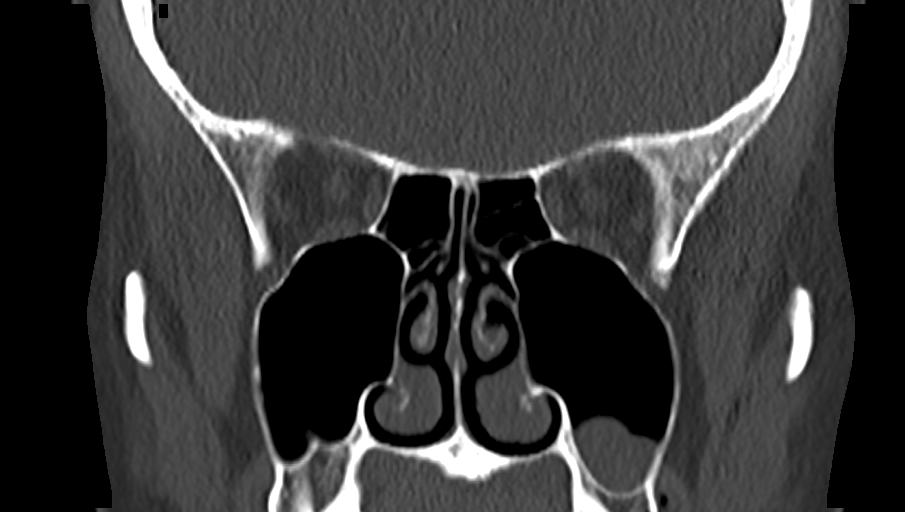
[im 22/39  bone]
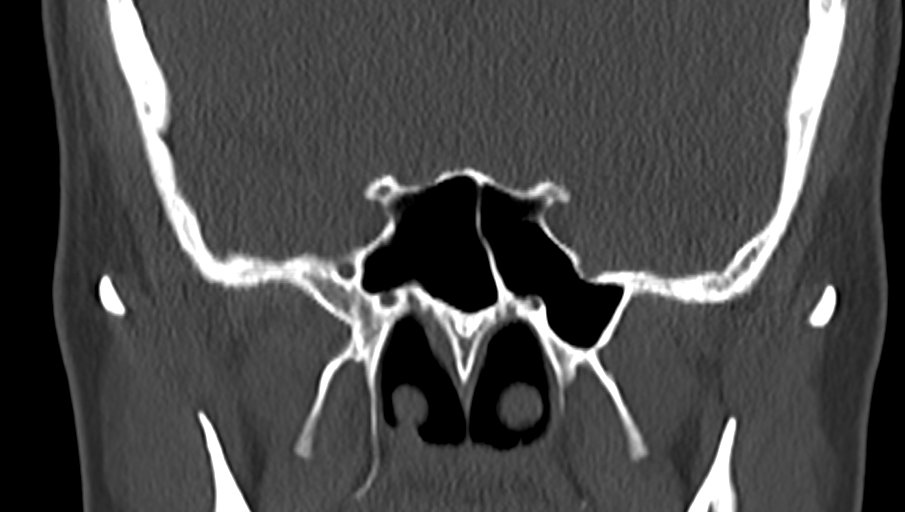

[Series 6: sinus sag 3.00 hr60 s3 · sagittal · 0.16mm/px · 3 of 49 slices shown]
[im 17/49  bone]
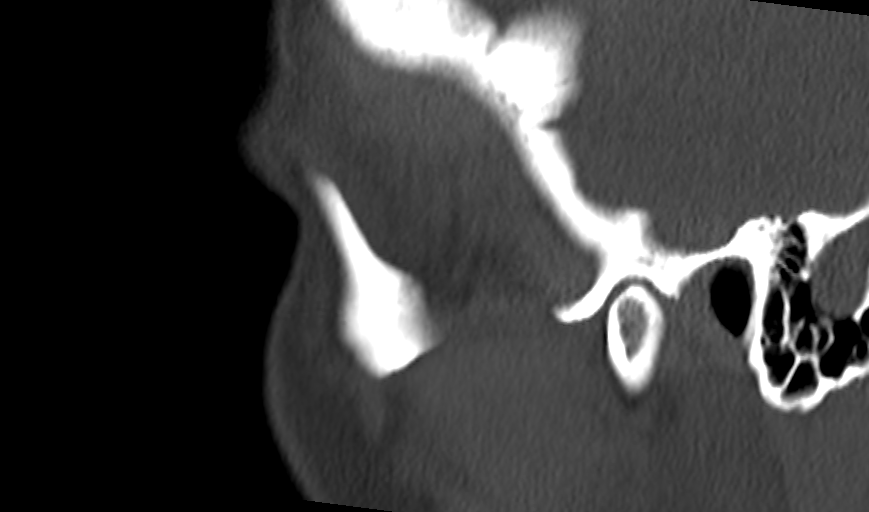
[im 25/49  bone]
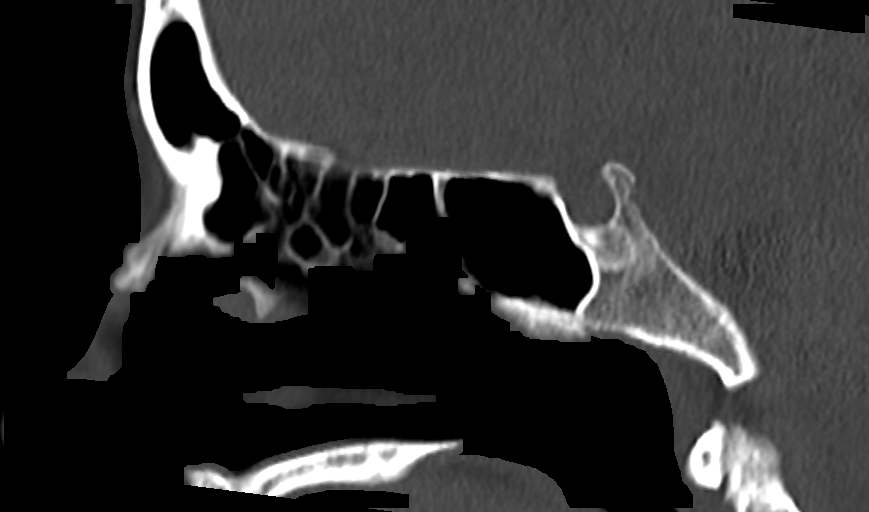
[im 33/49  bone]
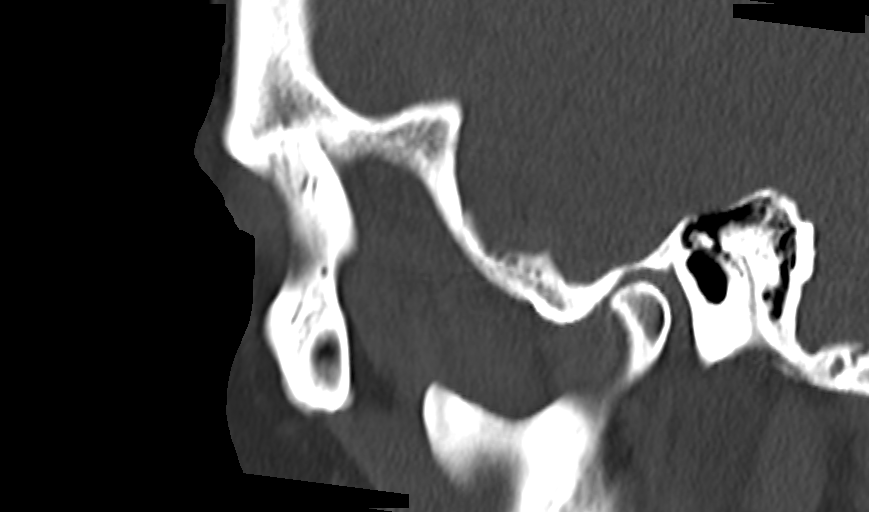

[Series 8: sinus ax 3.00 hr40 s3 · axial · 0.28mm/px · z∈[-521,-458]mm · 6 of 28 slices shown, 8 images]
[im 4/28  brain]
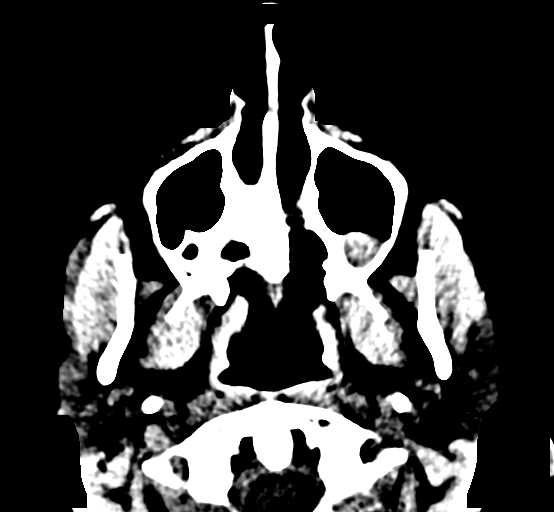
[im 4/28  bone]
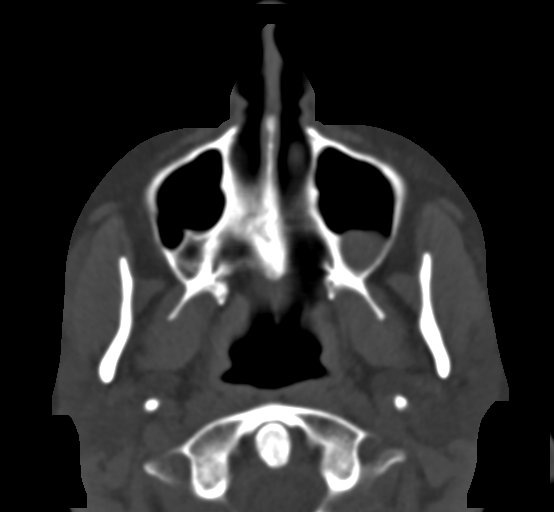
[im 8/28  bone]
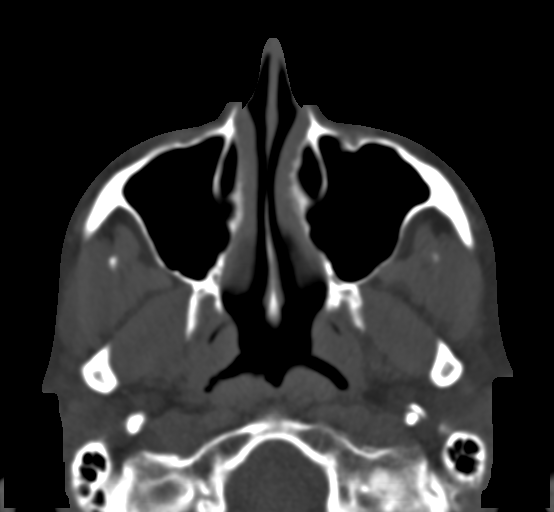
[im 12/28  bone]
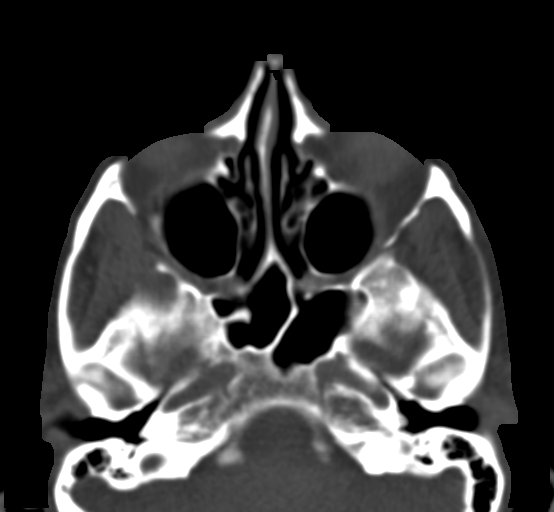
[im 16/28  bone]
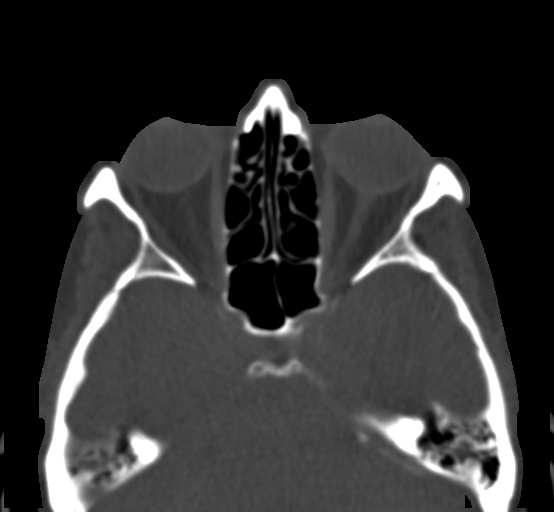
[im 21/28  brain]
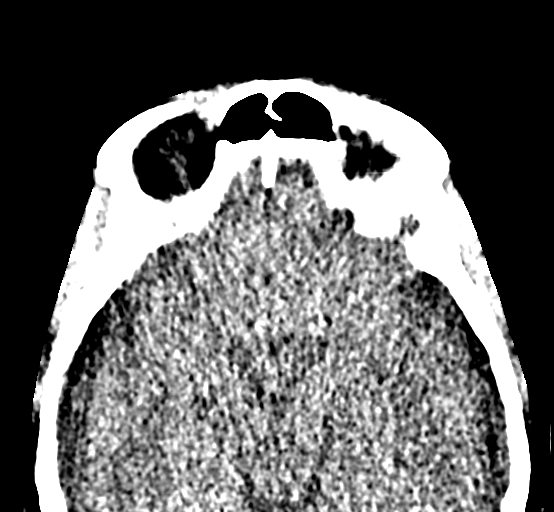
[im 21/28  bone]
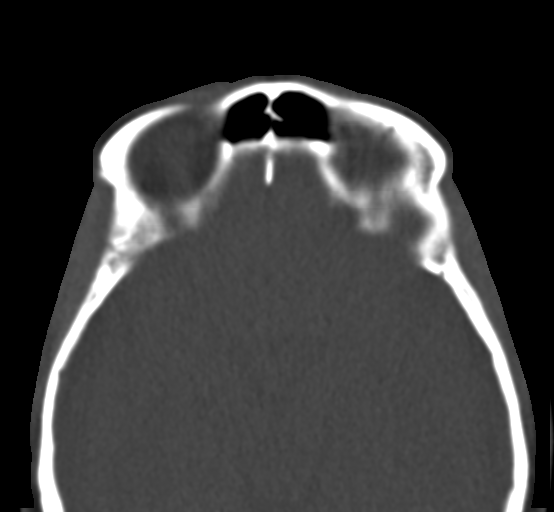
[im 24/28  bone]
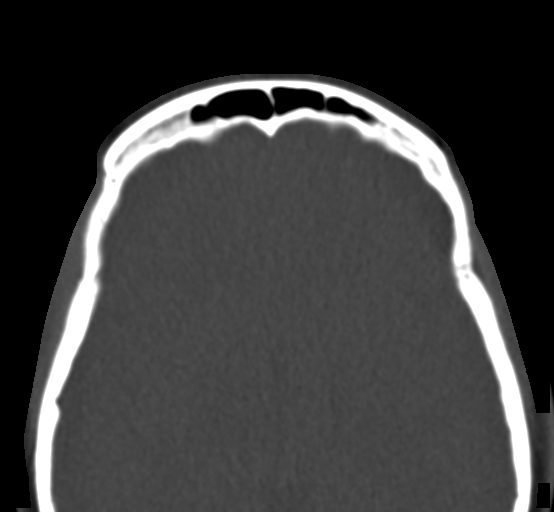

[12 of 47 positions shown; findings below may reference images not displayed]

DIAGNOSTIC STUDIES

EXAM

COMPUTED TOMOGRAPHY, MAXILLOFACIAL AREA; WITHOUT CONTRAST MATERIAL CPT 16751

INDICATION

sinusitis
RECURRENT SINUSITITS. FACIAL PAIN. SINUS CONGESTION.   HB

TECHNIQUE

Contiguous axial images were obtained through the facial bones. Coronal and sagittal images were
reconstructed.

All CT scans at this facility use dose modulation, iterative reconstruction, and/or weight based
dosing when appropriate to reduce radiation dose to as low as reasonably achievable.

0 CT and 0 nuclear scans in the last year.

COMPARISONS

No priors available for comparison.

FINDINGS

The frontal sinuses, ethmoid air cells and sphenoid sinuses appear well aerated. The maxillary
sinuses appear well aerated. There is a 1.3 cm polyp along the floor of the left maxillary sinus.
The ostiomeatal units are visualized. The infundibular patent. Minimal deviation of the nasal septum
to the right. Moderate mucosal thickening of the inferior turbinates. The mastoid air cells appear
well aerated. No acute fracture. No hyperostosis.

IMPRESSION

1. No evidence of acute or chronic sinusitis. Small polyp within the left maxillary sinus.

2. Moderate mucosal thickening of the inferior turbinates.

Tech Notes:

RECURRENT SINUSITITS. FACIAL PAIN. SINUS CONGESTION.
HB

## 2021-10-20 IMAGING — MR SPCERVWO
6 of 9 series · 27 of 48 positions shown · non-contrast
Comparison: none

[Series 5: T2 · sagittal · 3.0mm · 0.69mm/px · 3 of 17 slices shown (1 of 2)]
[im 1/17]
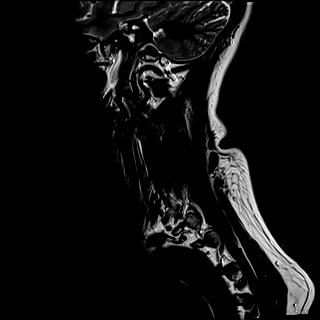
[im 9/17]
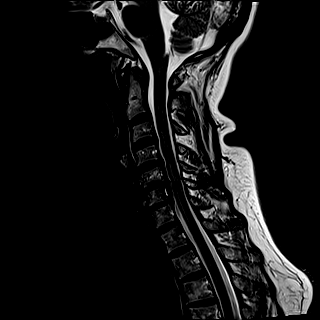
[im 17/17]
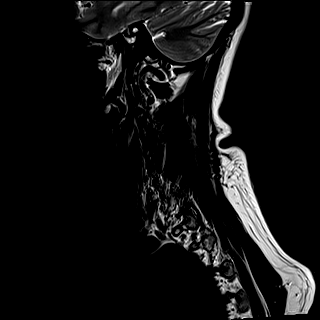

[Series 6: T1 · sagittal · 3.0mm · 0.43mm/px · 3 of 17 slices shown (1 of 2)]
[im 1/17]
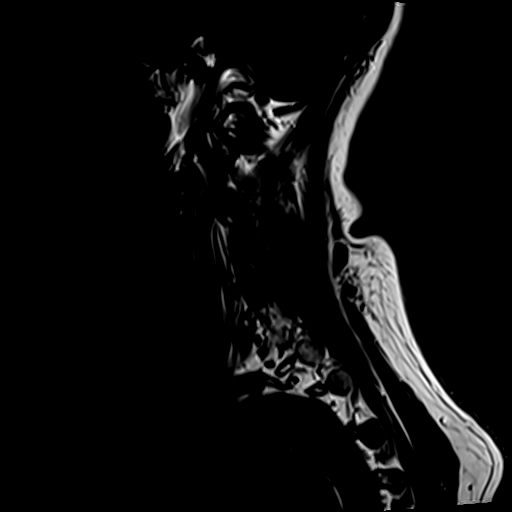
[im 9/17]
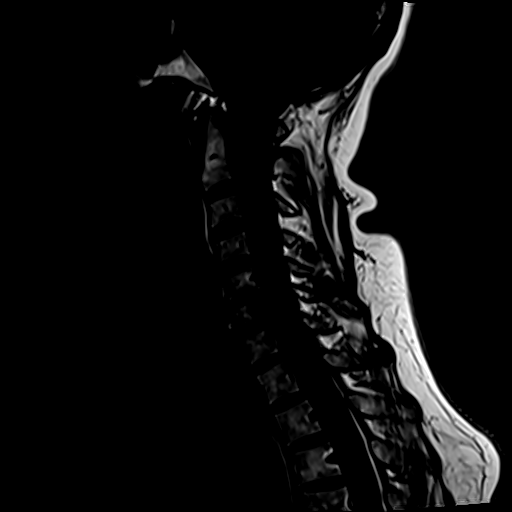
[im 17/17]
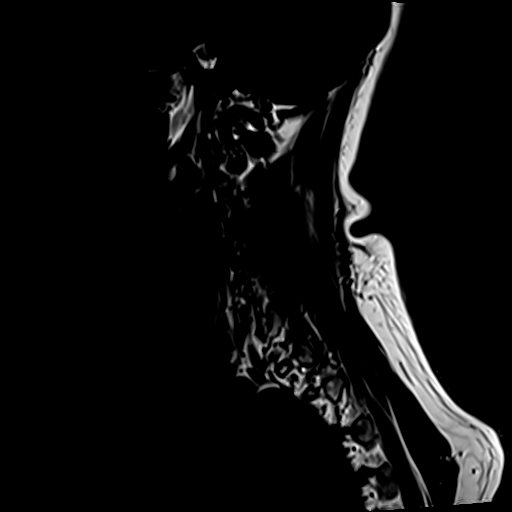

[Series 7: STIR · sagittal · 3.0mm · 0.86mm/px · 3 of 17 slices shown]
[im 1/17]
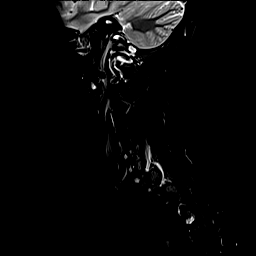
[im 9/17]
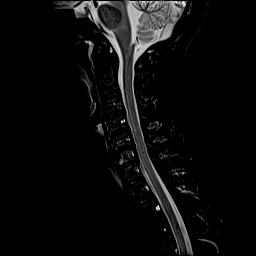
[im 17/17]
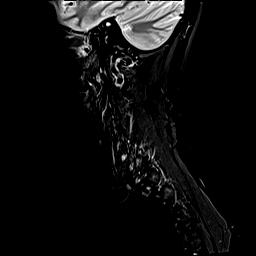

[Series 8: T2 · axial · 3.0mm · 0.70mm/px · z∈[-136,-29]mm · 6 of 35 slices shown (2 of 2)]
[im 1/35]
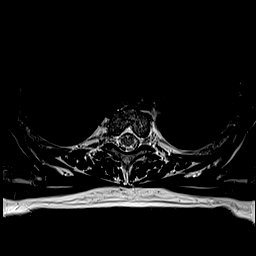
[im 7/35]
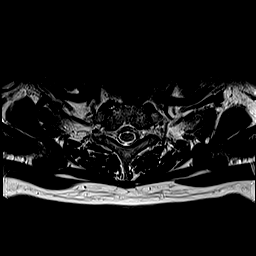
[im 14/35]
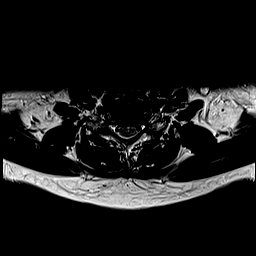
[im 21/35]
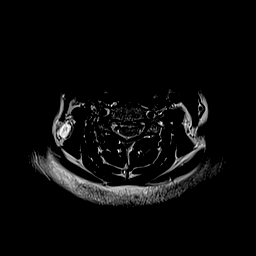
[im 28/35]
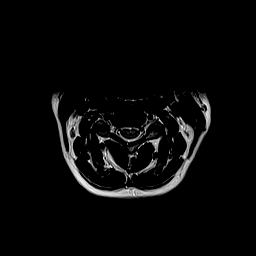
[im 35/35]
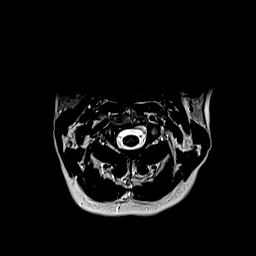

[Series 9: GRE · axial · 3.0mm · 0.47mm/px · z∈[-136,-29]mm · 6 of 35 slices shown]
[im 1/35]
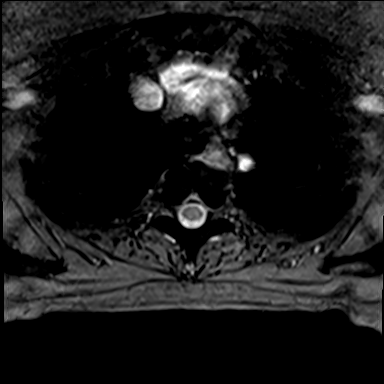
[im 7/35]
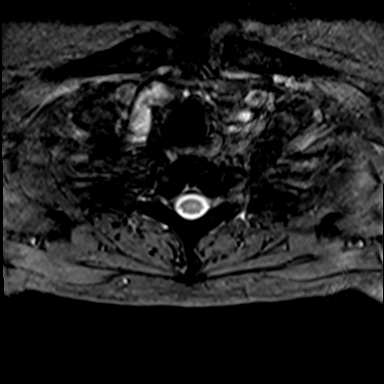
[im 14/35]
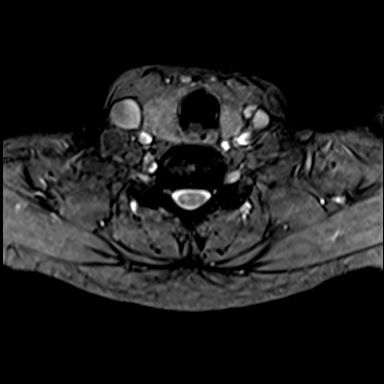
[im 21/35]
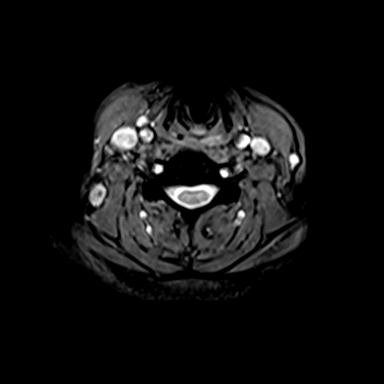
[im 28/35]
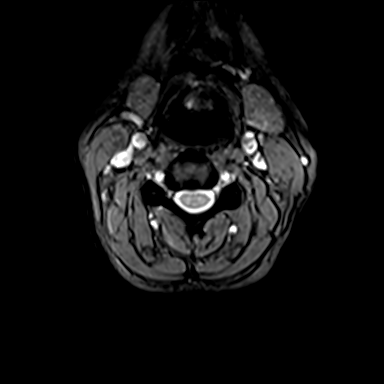
[im 35/35]
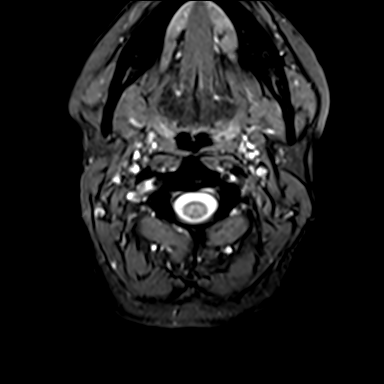

[Series 10: T1 · axial · 3.0mm · 0.35mm/px · z∈[-136,-29]mm · 6 of 35 slices shown (2 of 2)]
[im 1/35]
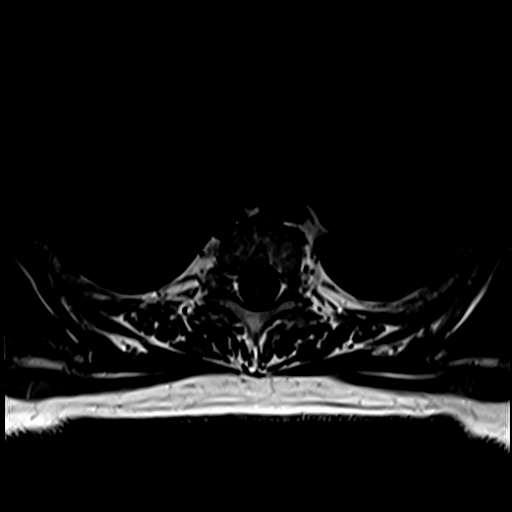
[im 7/35]
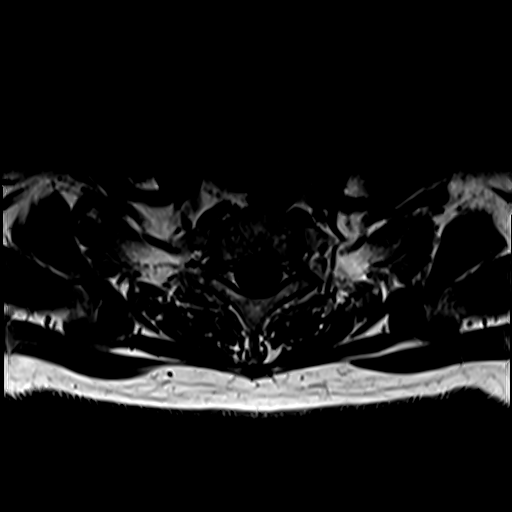
[im 14/35]
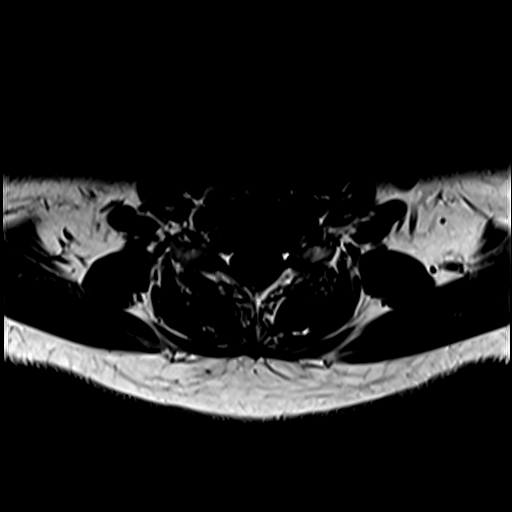
[im 21/35]
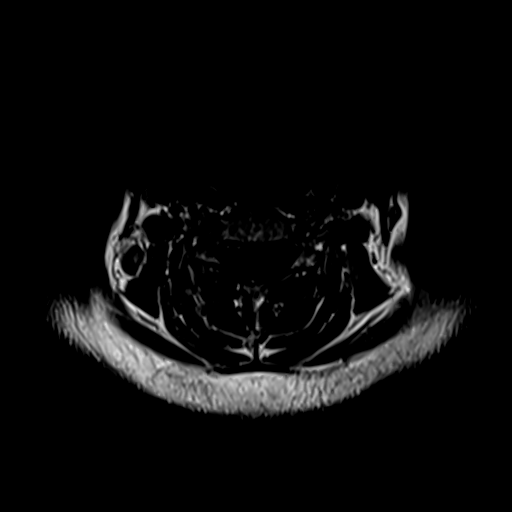
[im 28/35]
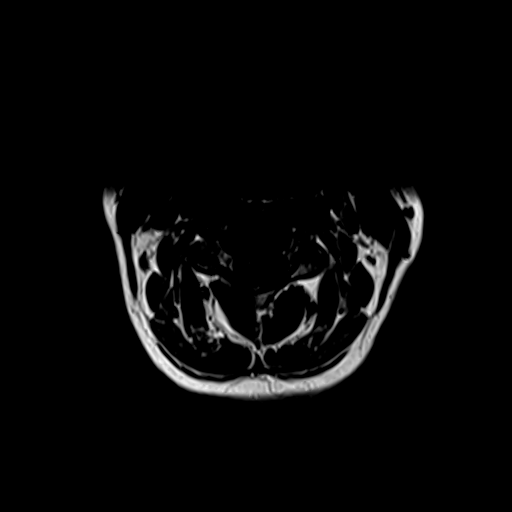
[im 35/35]
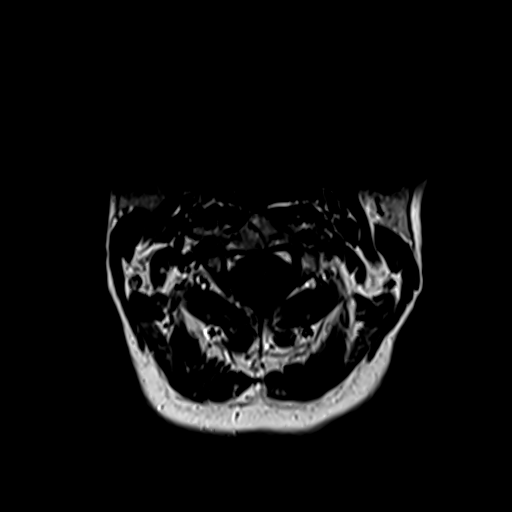

[27 of 48 positions shown; findings below may reference images not displayed]

EXAM

MAGNETIC RESONANCE IMAGING, SPINAL CANAL AND CONTENTS, CERVICAL; WITHOUT CONTRAST MATERIAL, CPT

INDICATION

CONTINUED BACK PAIN WITH NUMBNESS TO SHOULDER LEFT ARM/HAND, NEW ONSET DIZZINESS.  INCREASED BACK
PAIN WITH RADIATION.  RG

TECHNIQUE

Multisequence multiplanar MRI of the cervical spine was performed using standard departmental
protocol without contrast.

COMPARISONS

Previous MRI of the cervical spine dated 09/08/2020

FINDINGS

The visualized portions of the posterior fossa and skull base are unremarkable. Atlanto-dens
interval is preserved with mild hypertrophic change. Prevertebral soft tissues are normal.

AP and lateral alignment appear intact. Bone marrow and cord signal is unremarkable. There are no
epidural or paraspinal masses are appreciated.

Specific findings are seen at the following levels:

C2-C3:  There is no evidence of central canal stenosis or neural foraminal stenosis. There is no
evidence facet arthrosis.

C3-C4:  There is mild broad-based disc bulging with minimal ventral impingement on the thecal sac.
The left-sided neural foramina is patent. There is mild right-sided neural foraminal narrowing
secondary to lateral disc bulging.

C4-C5:  There is broad-based disc bulging with small annular tear. Mild ventral impingement on the
thecal sac is noted. The facet joints are preserved. The neural foramina are patent.

C5-C6:  There is broad-based disc bulging and an annular tear. Uncovertebral joint hypertrophy is
present and there is bilateral moderately severe neural foraminal narrowing.

C6-C7:  There is uncovertebral joint hypertrophy and intervertebral disc space narrowing. Posterior
disc bulging is present with ventral impingement on the thecal sac. Moderately severe bilateral
neural foraminal narrowing is present.

C7-T1:  There is no evidence of central canal stenosis or neural foraminal stenosis. There is no
evidence facet arthrosis.

Review of the soft tissues of the neck show no obvious masses or adenopathy.

IMPRESSION

Multilevel cervical spondylosis with the changes being most prominent at the C5-6 and C6-7 levels.
There is uncovertebral joint hypertrophy and posterior disc osteophyte complex formation as well as
moderately severe bilateral neural foraminal narrowing..

Tech Notes:

CONTINUED BACK PAIN WITH NUMBNESS TO SHOULDER LEFT ARM/HAND, NEW ONSET DIZZINESS.  INCREASED BACK
PAIN WITH RADIATION.  RG

## 2021-10-20 IMAGING — MR SPLUMBWO
9 of 12 series · 29 of 48 positions shown · non-contrast
Comparison: none

[Series 5: T2 · sagittal · 4.0mm · 0.68mm/px · 3 of 17 slices shown (1 of 4)]
[im 1/17]
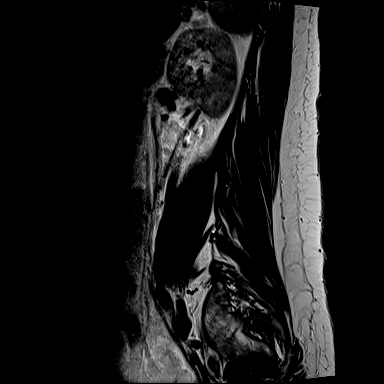
[im 9/17]
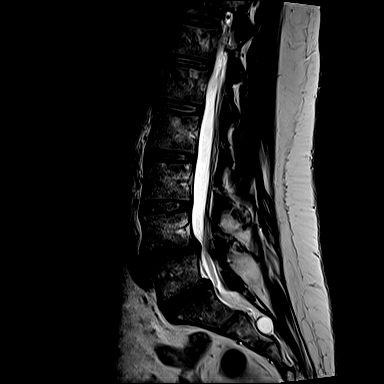
[im 17/17]
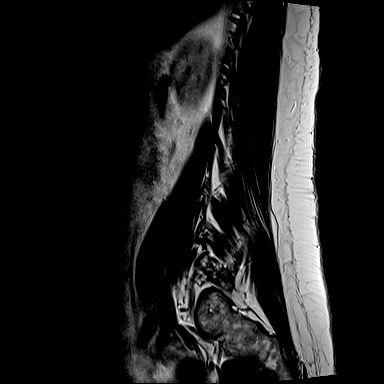

[Series 6: T1 · sagittal · 4.0mm · 0.81mm/px · 3 of 17 slices shown (1 of 4)]
[im 1/17]
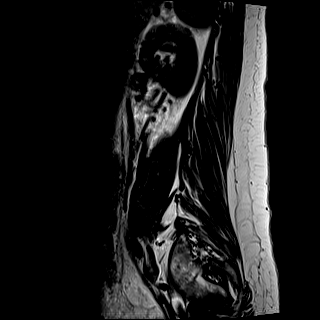
[im 9/17]
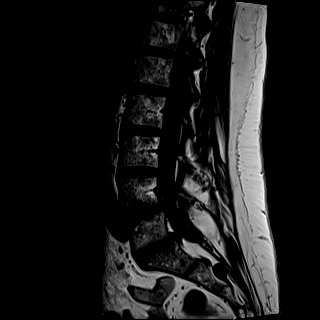
[im 17/17]
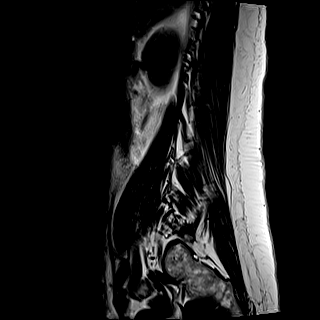

[Series 7: STIR · sagittal · 4.0mm · 0.51mm/px · 3 of 17 slices shown]
[im 1/17]
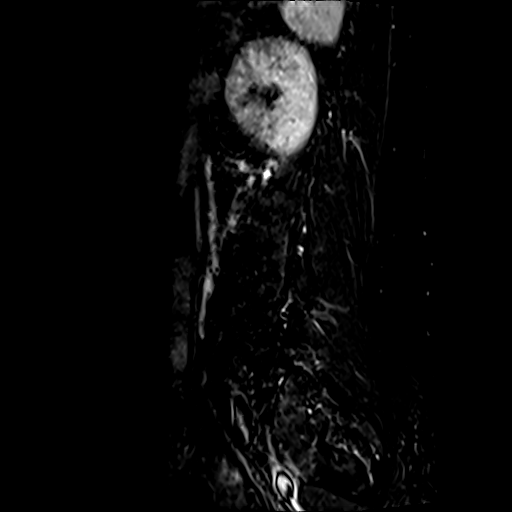
[im 9/17]
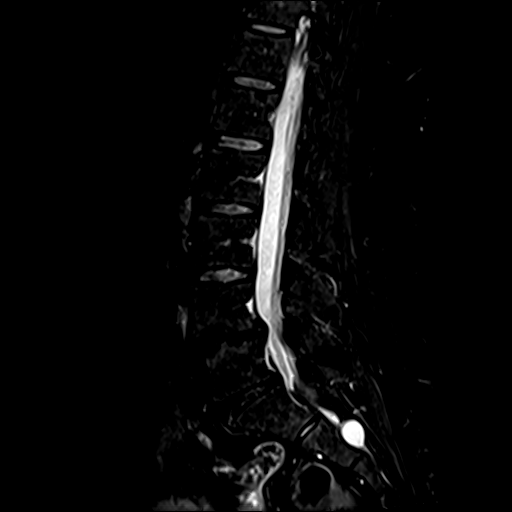
[im 17/17]
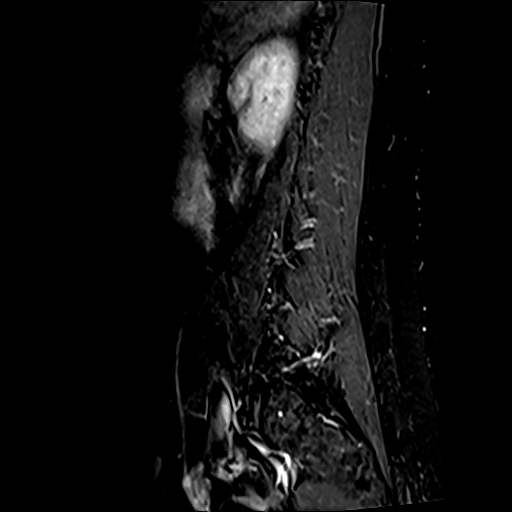

[Series 8: T2 · axial · 4.5mm · 0.81mm/px · z∈[+49,+167]mm · 3 of 23 slices shown (2 of 4)]
[im 1/23]
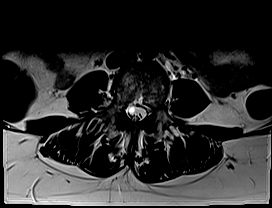
[im 12/23]
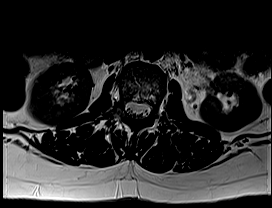
[im 23/23]
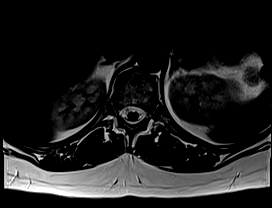

[Series 9: T2 · axial · 4.5mm · 0.81mm/px · z∈[-62,-14]mm · 2 of 12 slices shown (3 of 4)]
[im 1/12]
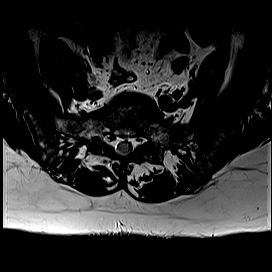
[im 12/12]
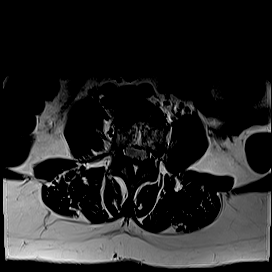

[Series 10: T2 · axial · 4.5mm · 0.81mm/px · z∈[-62,+167]mm · 5 of 31 slices shown (4 of 4)]
[im 1/31]
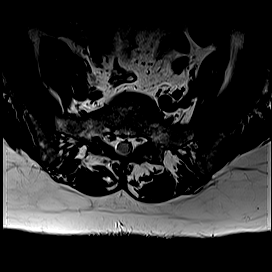
[im 8/31]
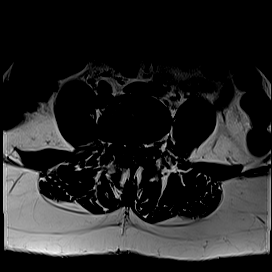
[im 16/31]
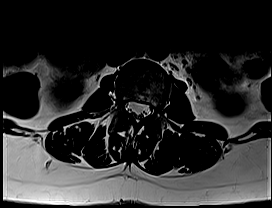
[im 23/31]
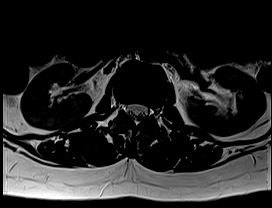
[im 31/31]
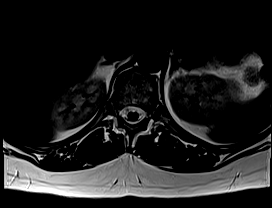

[Series 11: T1 · axial · 4.5mm · 0.43mm/px · z∈[+49,+167]mm · 3 of 23 slices shown (2 of 4)]
[im 1/23]
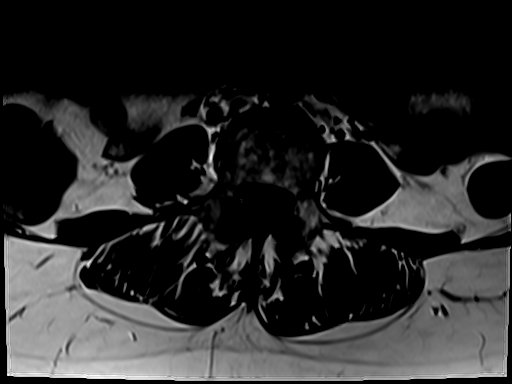
[im 12/23]
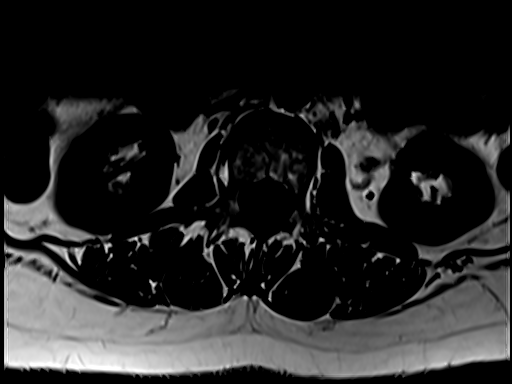
[im 23/23]
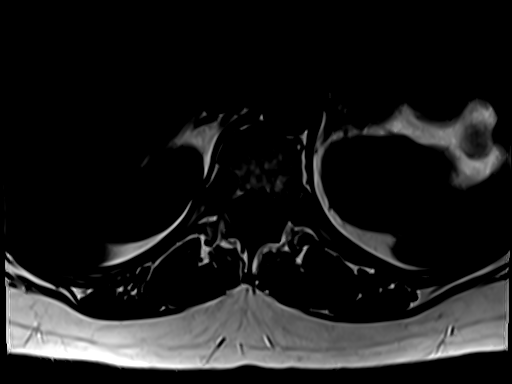

[Series 12: T1 · axial · 4.5mm · 0.43mm/px · z∈[-51,+5]mm · 2 of 11 slices shown (3 of 4)]
[im 1/11]
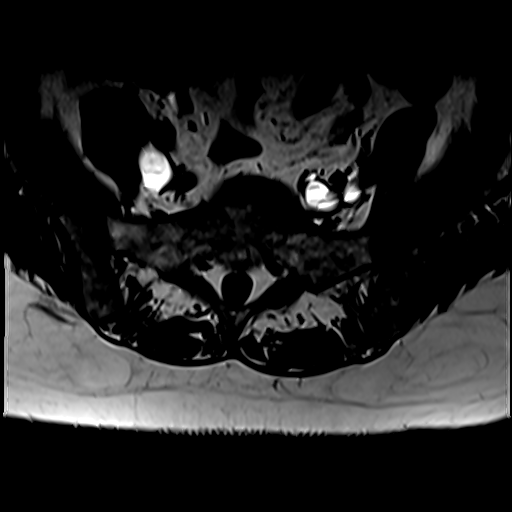
[im 11/11]
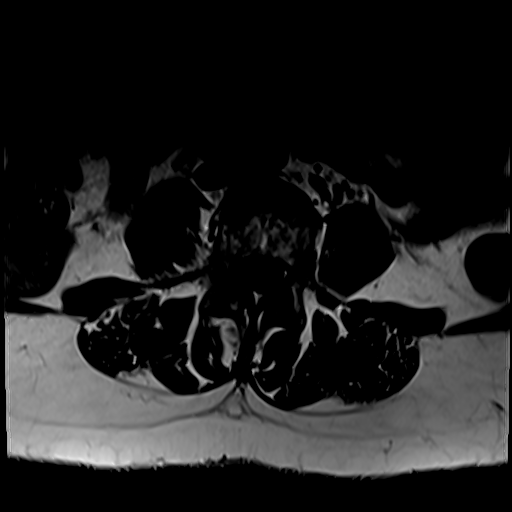

[Series 13: T1 · axial · 4.5mm · 0.43mm/px · z∈[-51,+167]mm · 5 of 32 slices shown (4 of 4)]
[im 1/32]
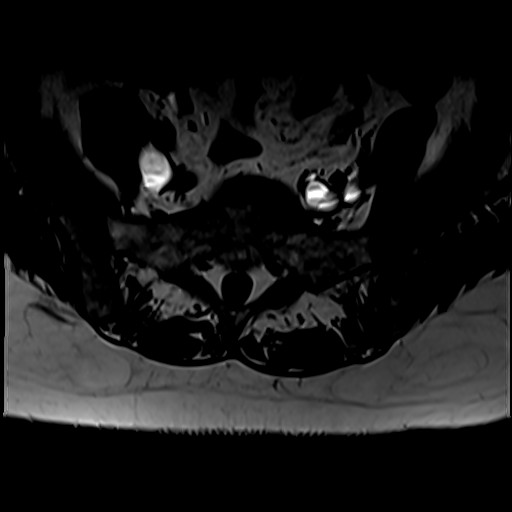
[im 8/32]
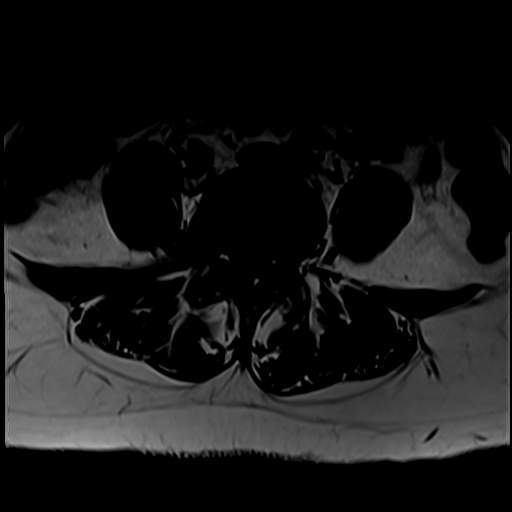
[im 16/32]
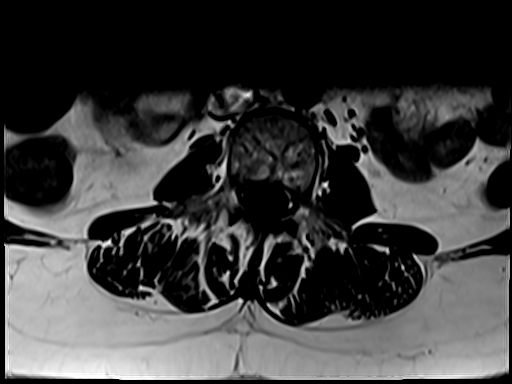
[im 24/32]
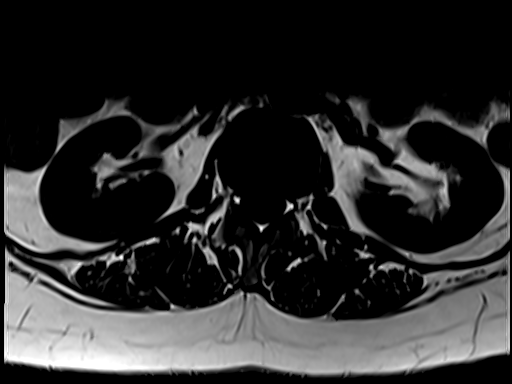
[im 32/32]
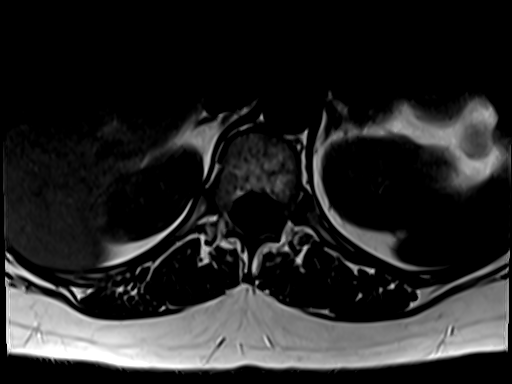

[29 of 48 positions shown; findings below may reference images not displayed]

EXAM

MAGNETIC RESONANCE IMAGING, SPINAL CANAL AND CONTENTS, LUMBAR; WITHOUT CONTRAST MATERIAL CPT 34684

INDICATION

CONTINUED BACK PAIN WITH NUMBNESS TO SHOULDER LEFT ARM/HAND, NEW ONSET DIZZINESS.  INCREASED BACK
PAIN WITH RADIATION.  RG

TECHNIQUE

Multisequence multiplanar MRI of the lumbar spine was performed with sagittal T1, T2, STIR, and
select axial T1 and T2 images without contrast.

COMPARISONS

Previous MRI of lumbar spine dated 08/19/2020.

FINDINGS

There are 5 nonrib bearing lumbar type vertebral bodies. AP and lateral alignment appear maintained.
Vertebral body heights are preserved. Conus terminates at  L1.

There are no focal paraspinal or epidural masses. Bone marrow and cord signal is normal. Specific
degenerative findings are seen at the following levels:

From T11 through L4 there is no evidence of central canal stenosis or neural foraminal stenosis.
There is no evidence facet arthrosis. No evidence of disc bulging or protrusion is present.
Vertebral body hemangioma at the L3 vertebral body level is redemonstrated.

L4-L5:  Modic type 3 endplate degenerative changes are redemonstrated. There is grade 1
anterolisthesis of L4 on L5.

Multifactorial severe central canal stenosis secondary to posterior disc bulging, ligamentum flavum
hypertrophy and facet arthrosis is present. Moderately severe right and moderate left-sided neural
foraminal narrowing secondary to lateral disc bulging is identified as well.

L5-S1:  There is mild subligamentous posterior disc bulging with a left lateral component. There is
bilateral facet arthrosis.

The right-sided neural foramina is patent. Moderately severe left-sided neural foraminal narrowing
secondary to lateral disc bulging and facet arthrosis is identified.

Perineural Tarlov cyst is identified at the S2 level on the left side. Visualized sacroiliac joints
and abdominal aorta are unremarkable. The paravertebral musculature appears intact. There is no
evidence of retroperitoneal adenopathy. Visualized aspects of the kidneys are within normal limits.

IMPRESSION

Multilevel lumbar spondylosis as described above. Findings are most prominent at the L4-5 level with
multifactorial central canal stenosis secondary to posterior disc bulging, facet arthrosis and
ligamentum flavum hypertrophy. Severe right-sided neural foraminal narrowing is present as well.
Findings are stable when compared with previous examination dated 08/19/2020.

Tech Notes:

CONTINUED BACK PAIN WITH NUMBNESS TO SHOULDER LEFT ARM/HAND, NEW ONSET DIZZINESS.  INCREASED BACK
PAIN WITH RADIATION.  RG

## 2024-01-03 ENCOUNTER — Encounter: Admit: 2024-01-03 | Discharge: 2024-01-03 | Payer: BLUE CROSS/BLUE SHIELD

## 2024-01-21 ENCOUNTER — Encounter: Admit: 2024-01-21 | Discharge: 2024-01-21 | Payer: BLUE CROSS/BLUE SHIELD
